# Patient Record
Sex: Female | Born: 1955
Health system: Southern US, Community
[De-identification: ages and names within clinical notes are randomized; demographics above are authoritative.]

## PROBLEM LIST (undated history)

## (undated) DIAGNOSIS — K5790 Diverticulosis of intestine, part unspecified, without perforation or abscess without bleeding: Secondary | ICD-10-CM

## (undated) DIAGNOSIS — K922 Gastrointestinal hemorrhage, unspecified: Secondary | ICD-10-CM

## (undated) DIAGNOSIS — Z8619 Personal history of other infectious and parasitic diseases: Secondary | ICD-10-CM

## (undated) DIAGNOSIS — T39395A Adverse effect of other nonsteroidal anti-inflammatory drugs [NSAID], initial encounter: Secondary | ICD-10-CM

## (undated) DIAGNOSIS — K219 Gastro-esophageal reflux disease without esophagitis: Secondary | ICD-10-CM

## (undated) DIAGNOSIS — I10 Essential (primary) hypertension: Secondary | ICD-10-CM

## (undated) DIAGNOSIS — Z860100 Personal history of colon polyps, unspecified: Secondary | ICD-10-CM

## (undated) DIAGNOSIS — M1990 Unspecified osteoarthritis, unspecified site: Secondary | ICD-10-CM

## (undated) DIAGNOSIS — E785 Hyperlipidemia, unspecified: Secondary | ICD-10-CM

## (undated) DIAGNOSIS — A630 Anogenital (venereal) warts: Secondary | ICD-10-CM

## (undated) DIAGNOSIS — M8580 Other specified disorders of bone density and structure, unspecified site: Secondary | ICD-10-CM

## (undated) HISTORY — DX: Hyperlipidemia, unspecified: E78.5

## (undated) HISTORY — DX: Unspecified osteoarthritis, unspecified site: M19.90

## (undated) HISTORY — DX: Gastro-esophageal reflux disease without esophagitis: K21.9

## (undated) HISTORY — DX: Other specified disorders of bone density and structure, unspecified site: M85.80

## (undated) HISTORY — PX: TUBAL LIGATION: SHX77

## (undated) HISTORY — DX: Adverse effect of other nonsteroidal anti-inflammatory drugs (NSAID), initial encounter: T39.395A

## (undated) HISTORY — DX: Personal history of colon polyps, unspecified: Z86.0100

## (undated) HISTORY — DX: Gastrointestinal hemorrhage, unspecified: K92.2

## (undated) HISTORY — DX: Personal history of colonic polyps: Z86.010

## (undated) HISTORY — DX: Personal history of other infectious and parasitic diseases: Z86.19

## (undated) HISTORY — DX: Essential (primary) hypertension: I10

## (undated) HISTORY — DX: Diverticulosis of intestine, part unspecified, without perforation or abscess without bleeding: K57.90

## (undated) HISTORY — DX: Anogenital (venereal) warts: A63.0

---

## 1997-12-23 HISTORY — PX: ABDOMINAL HYSTERECTOMY: SHX81

## 2004-12-23 LAB — HM PAP SMEAR: HM Pap smear: NORMAL

## 2009-12-23 LAB — HM COLONOSCOPY

## 2012-01-20 ENCOUNTER — Encounter: Payer: Self-pay | Admitting: Family

## 2012-01-20 ENCOUNTER — Ambulatory Visit (INDEPENDENT_AMBULATORY_CARE_PROVIDER_SITE_OTHER): Payer: Managed Care, Other (non HMO) | Admitting: Family

## 2012-01-20 DIAGNOSIS — M6208 Separation of muscle (nontraumatic), other site: Secondary | ICD-10-CM

## 2012-01-20 DIAGNOSIS — R300 Dysuria: Secondary | ICD-10-CM

## 2012-01-20 DIAGNOSIS — M6200 Separation of muscle (nontraumatic), unspecified site: Secondary | ICD-10-CM

## 2012-01-20 DIAGNOSIS — Z0000 Encounter for general adult medical examination without abnormal findings: Secondary | ICD-10-CM

## 2012-01-20 DIAGNOSIS — R3129 Other microscopic hematuria: Secondary | ICD-10-CM

## 2012-01-20 DIAGNOSIS — E785 Hyperlipidemia, unspecified: Secondary | ICD-10-CM

## 2012-01-20 DIAGNOSIS — B9789 Other viral agents as the cause of diseases classified elsewhere: Secondary | ICD-10-CM

## 2012-01-20 DIAGNOSIS — R5381 Other malaise: Secondary | ICD-10-CM

## 2012-01-20 DIAGNOSIS — B349 Viral infection, unspecified: Secondary | ICD-10-CM

## 2012-01-20 DIAGNOSIS — R5383 Other fatigue: Secondary | ICD-10-CM

## 2012-01-20 LAB — LIPID PANEL
Cholesterol: 287 mg/dL — ABNORMAL HIGH (ref 0–200)
Triglycerides: 219 mg/dL — ABNORMAL HIGH (ref <150)
VLDL: 44 mg/dL — ABNORMAL HIGH (ref 0–40)

## 2012-01-20 LAB — HEPATIC FUNCTION PANEL
ALT: 192 U/L — ABNORMAL HIGH (ref 0–35)
AST: 400 U/L — ABNORMAL HIGH (ref 0–37)
Alkaline Phosphatase: 99 U/L (ref 39–117)
Indirect Bilirubin: 0.6 mg/dL (ref 0.0–0.9)
Total Protein: 7.4 g/dL (ref 6.0–8.3)

## 2012-01-20 LAB — POCT URINALYSIS DIPSTICK
Protein, UA: NEGATIVE
Spec Grav, UA: 1.02
Urobilinogen, UA: 0.2
pH, UA: 6

## 2012-01-20 LAB — CBC WITH DIFFERENTIAL/PLATELET
Basophils Relative: 0 % (ref 0–1)
Hemoglobin: 14.9 g/dL (ref 12.0–15.0)
Lymphs Abs: 2.6 10*3/uL (ref 0.7–4.0)
Monocytes Relative: 6 % (ref 3–12)
Neutro Abs: 5.2 10*3/uL (ref 1.7–7.7)
Neutrophils Relative %: 61 % (ref 43–77)
RBC: 4.3 MIL/uL (ref 3.87–5.11)

## 2012-01-20 LAB — TSH: TSH: 3.772 u[IU]/mL (ref 0.350–4.500)

## 2012-01-20 MED ORDER — AMOXICILLIN-POT CLAVULANATE 875-125 MG PO TABS: 1.0000 | ORAL_TABLET | Freq: Two times a day (BID) | ORAL | Status: AC

## 2012-01-20 NOTE — Progress Notes (Signed)
  Subjective:    Patient ID: Dawn Huber, female    DOB: August 12, 1956, 56 y.o.   MRN: [id number]  HPI  [name] is a 56 yr old female who presents today to establish care. She was followed previously by [facility]. She tells me that she saw "whoever could get me in over there." She comes today to discuss concerns about swollen glands.    Swollen glands- she reports that her symptoms started as a mild sore throat about 3.5  weeks ago. She also notes some mild associated sore throat and ear pain.  Notes that it looked like she had an "inflatable tube" around the base of my neck.  In the last few weeks she reports associated fatigue, malaise and anorexia. She has lost 10 pounds.  She reports that the lymphadenopathy was never painful- just felt like pressure.  She notes some continued throat pain. Denies associated fever  Hyperlipidemia- she reports that she was unable to tolerate pravastatin.  Has been working on diet.  She reports that she had hemorrhoidal bleeding after a colonoscopy. Otherwise, she has not had any rectal bleeding. She reports + polyps in 2008- 2 precancerouse polyps.  2011, she repeated colo had 1 polyp- was told to follow up in 5 yrs. Dr. Jasmine Huber. She was told that she has a ventral hernia.  Notes  "lot of gas."  Genital wart-  Removed about 4-5 yrs ago.  Never came back.      Review of Systems  Constitutional: Positive for fatigue and unexpected weight change.  HENT: Positive for neck pain. Negative for congestion.   Eyes:       Wears corrective glasses  Respiratory: Negative for cough.   Cardiovascular:       Notes some swelling in the left ankle.  Chronic problem.    Gastrointestinal: Positive for diarrhea. Negative for nausea and vomiting.       Diarrhea x 1 week.  She reports that she normally has several BM's a day  Genitourinary: Positive for dysuria.  Musculoskeletal: Negative for myalgias.  Neurological:       + HA at base of head, mild    Hematological: Positive for adenopathy.  Psychiatric/Behavioral:       Denies depression/anxiety.         Objective:   Physical Exam  Constitutional: She appears well-developed and well-nourished.  HENT:  Head: Normocephalic and atraumatic.  Right Ear: Tympanic membrane and ear canal normal.  Left Ear: Tympanic membrane and ear canal normal.  Mouth/Throat: No posterior oropharyngeal edema or posterior oropharyngeal erythema.  Eyes: No scleral icterus.  Neck: Normal range of motion. Neck supple.       Exam is limited by overlying adipose, however I do not palpate significantly enlarged lymph nodes.  I do palpate a slightly tender LN above left clavicle which feels to be < 1cm  Cardiovascular: Normal rate and regular rhythm.   No murmur heard. Pulmonary/Chest: Effort normal and breath sounds normal. No respiratory distress. She has no wheezes. She has no rales. She exhibits no tenderness.  Abdominal: Soft. Bowel sounds are normal. She exhibits no distension. There is no tenderness.  Psych: she became tearful briefly during the interview.  A and O x 3, calm, pleasant and cooperative.         Assessment & Plan:

## 2012-01-20 NOTE — Patient Instructions (Signed)
Please complete your lab work prior to leaving.  Schedule a follow up/physical in 2 weeks.  Welcome to [facility]!

## 2012-01-20 NOTE — Assessment & Plan Note (Signed)
Tolerating Pravastatin. Obtain LFT, FLP.

## 2012-01-20 NOTE — Assessment & Plan Note (Addendum)
I suspect that pt's symptoms are most likely due to a viral illness.  Given her complaints about lymphadenopathy however, will plan to treat empirically with augmentin.  Will have pt follow up in 2 weeks, if no improvement consider CT neck.  Obtain baseline laboratories as below.

## 2012-01-20 NOTE — Assessment & Plan Note (Signed)
Clinically consistent with rectus diastasis. We discussed weight loss today. Monitor for now, if worsening or symptomatic, consider referral to surgery.

## 2012-01-20 NOTE — Assessment & Plan Note (Signed)
New, UA notes trace blood. Obtain culture.

## 2012-01-21 ENCOUNTER — Telehealth: Payer: Self-pay | Admitting: Family

## 2012-01-21 LAB — BASIC METABOLIC PANEL WITH GFR
CO2: 27 mEq/L (ref 19–32)
GFR, Est African American: >89 mL/min
Glucose, Bld: 89 mg/dL (ref 70–99)
Potassium: 4.3 mEq/L (ref 3.5–5.3)
Sodium: 137 mEq/L (ref 135–145)

## 2012-01-21 NOTE — Telephone Encounter (Addendum)
Spoke to patient to let her know that her liver function tests are very high. She reports feeling ok today.  She will return tomorrow at 8:45 for an apt for further evaluation.

## 2012-01-21 NOTE — Telephone Encounter (Signed)
Appointment has been made

## 2012-01-21 NOTE — Telephone Encounter (Signed)
Pls add her to schedule for tomorrow at 8:45.

## 2012-01-22 ENCOUNTER — Telehealth: Payer: Self-pay | Admitting: Family

## 2012-01-22 ENCOUNTER — Ambulatory Visit
Admission: RE | Admit: 2012-01-22 | Discharge: 2012-01-22 | Disposition: A | Discharge status: Home / Self Care | Payer: Managed Care, Other (non HMO) | Source: Ambulatory Visit | Attending: Family | Admitting: Family

## 2012-01-22 ENCOUNTER — Ambulatory Visit (INDEPENDENT_AMBULATORY_CARE_PROVIDER_SITE_OTHER): Payer: Managed Care, Other (non HMO) | Admitting: Family

## 2012-01-22 ENCOUNTER — Encounter: Payer: Self-pay | Admitting: Family

## 2012-01-22 DIAGNOSIS — R109 Unspecified abdominal pain: Secondary | ICD-10-CM

## 2012-01-22 DIAGNOSIS — R7401 Elevation of levels of liver transaminase levels: Secondary | ICD-10-CM | POA: Insufficient documentation

## 2012-01-22 DIAGNOSIS — R591 Generalized enlarged lymph nodes: Secondary | ICD-10-CM

## 2012-01-22 DIAGNOSIS — R599 Enlarged lymph nodes, unspecified: Secondary | ICD-10-CM

## 2012-01-22 LAB — HEPATIC FUNCTION PANEL
Albumin: 4.5 g/dL (ref 3.5–5.2)
Alkaline Phosphatase: 98 U/L (ref 39–117)
Bilirubin, Direct: 0.1 mg/dL (ref 0.0–0.3)
Indirect Bilirubin: 0.5 mg/dL (ref 0.0–0.9)
Total Bilirubin: 0.6 mg/dL (ref 0.3–1.2)

## 2012-01-22 LAB — SEDIMENTATION RATE: Sed Rate: 10 mm/hr (ref 0–22)

## 2012-01-22 MED ORDER — IOHEXOL 300 MG/ML  SOLN
125.0000 mL | Freq: Once | INTRAMUSCULAR | Status: AC | PRN
  Administered 2012-01-22: 125 mL via INTRAVENOUS

## 2012-01-22 MED ORDER — IOHEXOL 300 MG/ML  SOLN
30.0000 mL | Freq: Once | INTRAMUSCULAR | Status: AC | PRN
  Administered 2012-01-22: 30 mL via ORAL

## 2012-01-22 NOTE — Progress Notes (Signed)
Subjective:    Patient ID: Dawn Huber, female    DOB: 01/16/1956, 56 y.o.   MRN: [id number]  HPI  [name] is a 56 yr old female who presents today for follow up.  She was seen on 1/28 with complaint of anorexia, malaise and LAD x 3 weeks.   She was started on empiric Augmentin and blood work was obtained.  She was noted to have a macrocytosis on CBC and also noted to have significantly elevated transaminases.  AST 400, ALT 192.  Today she reports that she has had some abdominal pain, and bloating. She continues to have associated anorexia but denies associated fever.  Of note, she tells me that her husband has hx of hepatitis C and has undergone interferon therapy. She tells me that she was tested for hepatitis C 10 yrs ago and was negative. She also tells me that she drinks wine about 3 nights a week.  Reports that on Sunday she had four 8 ounce glasses of wine.  She is on several supplements- one of which is some sort of OTC "liver supplement."   Rash- she reports a 1 yr hx of rash on her left hand which she tells me she was told was "inflammation."  Review of Systems    see HPI  Past Medical History  Diagnosis Date  . History of chicken pox   . Genital warts   . Hypertension   . Personal history of colonic polyps   . Diverticulosis     History   Social History  . Marital Status: Married    Spouse Name: N/A    Number of Children: 2  . Years of Education: N/A   Occupational History  . Not on file.   Social History Main Topics  . Smoking status: Current Everyday Smoker  . Smokeless tobacco: Never Used   Comment: 2 packs a week  . Alcohol Use: Yes     2 liters wine a week  . Drug Use: Not on file  . Sexually Active: Not on file   Other Topics Concern  . Not on file   Social History Narrative   Regular exercise:  No, walks dog dailyCaffeine Use:  12oz pepsi dailyWorks as a hairstylist.She has 2 grown children and 4 grandchildren- family lives locally.Married       Past Surgical History  Procedure Date  . Abdominal hysterectomy 1999    partial    Family History  Problem Relation Age of Onset  . Cancer Mother     lung  . Emphysema Father   . Stroke Father   . Seizures Father   . Heart disease Father   . Hypertension Father   . Cancer Maternal Aunt     colon    No Known Allergies  Current Outpatient Prescriptions on File Prior to Visit  Medication Sig Dispense Refill  . amoxicillin-clavulanate (AUGMENTIN) 875-125 MG per tablet Take 1 tablet by mouth every 12 (twelve) hours.  20 tablet  0  . aspirin 81 MG tablet Take 324 mg by mouth daily.      . Calcium Carbonate-Vit D-Min (CALCIUM 1200 PO) Take 1 tablet by mouth daily.      . Cholecalciferol (VITAMIN D3) 2000 UNITS TABS Take 1 tablet by mouth daily.      . lansoprazole (PREVACID) 15 MG capsule Take 15 mg by mouth daily.        BP 130/90  Pulse 74  Temp(Src) 98.1 F (36.7 C) (Oral)  Resp 16  Ht 5' 3.5" (1.613 m)  Wt 215 lb 1.3 oz (97.56 kg)  BMI 37.50 kg/m2  SpO2 98%    Objective:   Physical Exam  Constitutional: She appears well-developed and well-nourished. No distress.  Cardiovascular: Normal rate and regular rhythm.   No murmur heard. Pulmonary/Chest: Effort normal and breath sounds normal. No respiratory distress. She has no wheezes. She has no rales. She exhibits no tenderness.  Abdominal:       Mild epigastric and right upper quadrant tenderness.   Skin: Skin is warm and dry.       Hyperpigmented rash on left hand.   Psychiatric: She has a normal mood and affect. Her behavior is normal. Judgment and thought content normal.          Assessment & Plan:

## 2012-01-22 NOTE — Telephone Encounter (Signed)
Notified pt. 

## 2012-01-22 NOTE — Telephone Encounter (Signed)
Patient returned phone call. [facility] # [phone]

## 2012-01-22 NOTE — Patient Instructions (Signed)
Please complete your lab work prior to leaving and your CT on the first floor. Please follow up in 2 weeks.

## 2012-01-22 NOTE — Assessment & Plan Note (Addendum)
Etiology is unclear at this point.  Acute viral illness, viral hepatitis (?acute hep C) is a possibility.  Due to abdominal pain, will obtain a CT abd/pelvis to further evaluate.  In addition will obtain follow up LFT's to monitor trend, acute hepatitis panel and HIV screen.  Will also add autoimmune studies due to associated rash.  Continue empiric Augmentin. Pt instructed to stop all supplements and abstain from alcohol. Follow up in 2 weeks.  Case was discussed with Dr. Rodena Medin.

## 2012-01-22 NOTE — Telephone Encounter (Signed)
Left message requesting that pt return our call. CT looks good.  Just notes fatty liver.  She should work on low fat/low cholesterol diet, exercise and weight loss to help this.  Also notes hiatal hernia which is common.  No other liver abnormalities.  Blood work is still pending.

## 2012-01-23 LAB — HEPATITIS PANEL, ACUTE
HCV Ab: NEGATIVE
Hep A IgM: NEGATIVE
Hep B C IgM: NEGATIVE

## 2012-01-23 LAB — HIV ANTIBODY (ROUTINE TESTING W REFLEX): HIV: NONREACTIVE

## 2012-01-23 LAB — RHEUMATOID FACTOR: Rhuematoid fact SerPl-aCnc: <10 IU/mL (ref <=14)

## 2012-01-24 ENCOUNTER — Telehealth: Payer: Self-pay | Admitting: Family

## 2012-01-24 NOTE — Telephone Encounter (Signed)
Please let pt know that her liver function tests are improving.  Hepatitis and HIV screens are negative.  Hopefully things will continue to improve. I would still like to see her back for her 2 week follow up apt please.

## 2012-01-24 NOTE — Telephone Encounter (Signed)
Notified pt, she will follow up on 02/03/12. Pt states gland on left side of neck may be a little smaller and she is still taking the antibiotic. Advised pt to complete antibiotic and give [place] a call if the gland continues to get larger.

## 2012-01-28 ENCOUNTER — Telehealth: Payer: Self-pay | Admitting: Family

## 2012-01-28 NOTE — Telephone Encounter (Signed)
Pt called asking if ok to take robitussin DM for some congestion.  I told her that it is OK.

## 2012-02-03 ENCOUNTER — Encounter: Payer: Self-pay | Admitting: Family

## 2012-02-03 ENCOUNTER — Ambulatory Visit (INDEPENDENT_AMBULATORY_CARE_PROVIDER_SITE_OTHER): Payer: Managed Care, Other (non HMO) | Admitting: Family

## 2012-02-03 DIAGNOSIS — Z0000 Encounter for general adult medical examination without abnormal findings: Secondary | ICD-10-CM | POA: Insufficient documentation

## 2012-02-03 DIAGNOSIS — D7589 Other specified diseases of blood and blood-forming organs: Secondary | ICD-10-CM

## 2012-02-03 DIAGNOSIS — R82998 Other abnormal findings in urine: Secondary | ICD-10-CM

## 2012-02-03 DIAGNOSIS — R3129 Other microscopic hematuria: Secondary | ICD-10-CM

## 2012-02-03 DIAGNOSIS — R7989 Other specified abnormal findings of blood chemistry: Secondary | ICD-10-CM

## 2012-02-03 LAB — POCT URINALYSIS DIPSTICK
Bilirubin, UA: NEGATIVE
Glucose, UA: NEGATIVE
Ketones, UA: NEGATIVE
Leukocytes, UA: NEGATIVE
pH, UA: 6

## 2012-02-03 LAB — HEPATIC FUNCTION PANEL
AST: 120 U/L — ABNORMAL HIGH (ref 0–37)
Alkaline Phosphatase: 82 U/L (ref 39–117)
Bilirubin, Direct: 0.1 mg/dL (ref 0.0–0.3)
Total Bilirubin: 0.4 mg/dL (ref 0.3–1.2)

## 2012-02-03 NOTE — Assessment & Plan Note (Signed)
Refer for dexa.  Pt was commended on discontinuation of alcohol and cutting back on smoking.  I encouraged her to continue with complete smoking cessation.  We also talked about exercise, weight loss, low fat diet.

## 2012-02-03 NOTE — Patient Instructions (Signed)
Please complete your blood work prior to leaving. Schedule your bone density at the front desk. Follow up in 3 months.

## 2012-02-03 NOTE — Assessment & Plan Note (Signed)
She is noted to have microscopic hematuria today, no leuks or nitrites.  Monitor for now.

## 2012-02-03 NOTE — Progress Notes (Signed)
Subjective:    Patient ID: Dawn Huber, female    DOB: 1956-07-12, 56 y.o.   MRN: [id number]  HPI  [name] is a 56 yr old female who presents today for her annual CPX.  Preventative- Recently started back at the gym. Eating healthy.  Last pap was 2007, last mammogram was <1 yr ago.  She thinks it was [place] and was normal.  She had colo 11/11- told that she needs a 5 yr follow up. Dexa- never.  She has completely discontinued use of alcohol.  Lymphadenopathy-  Notes less stomach pressure.  Appetite is still not "big", but trying to eat healthy and has recently lost 2 pounds.  Less nausea than when she was eating "fatty greasy stuff."   Tobacco- down to 1 pack of cigattes a week.  Still working on quitting.   She reports that her urine has been dark.  Denies dysuria.   Review of Systems  Constitutional: Negative for unexpected weight change.  HENT: Negative for tinnitus.        Deaf in left ear since age 71  Eyes: Negative for visual disturbance.  Respiratory: Negative for shortness of breath.   Cardiovascular: Negative for chest pain.  Gastrointestinal: Negative for nausea.  Genitourinary: Negative for hematuria.  Musculoskeletal: Negative for myalgias and arthralgias.  Skin: Negative for rash.  Neurological: Positive for headaches.       Mild headaches  Hematological: Positive for adenopathy.  Psychiatric/Behavioral:       Denies depression/anxiety   Past Medical History  Diagnosis Date  . History of chicken pox   . Genital warts   . Hypertension   . Personal history of colonic polyps   . Diverticulosis     History   Social History  . Marital Status: Married    Spouse Name: N/A    Number of Children: 2  . Years of Education: N/A   Occupational History  . Not on file.   Social History Main Topics  . Smoking status: Current Everyday Smoker  . Smokeless tobacco: Never Used   Comment: 1 packs a week  . Alcohol Use: No  . Drug Use: Not on file  .  Sexually Active: Not on file   Other Topics Concern  . 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 Abdominal hysterectomy 1999    partial    Family History  Problem Relation Age of Onset  . Cancer Mother     lung  . Emphysema Father   . Stroke Father   . Seizures Father   . Heart disease Father   . Hypertension Father   . Cancer Maternal Aunt     colon    No Known Allergies  Current Outpatient Prescriptions on File Prior to Visit  Medication Sig Dispense Refill  . aspirin 81 MG tablet Take 324 mg by mouth daily.      . Calcium Carbonate-Vit D-Min (CALCIUM 1200 PO) Take 1 tablet by mouth daily.      . Cholecalciferol (VITAMIN D3) 2000 UNITS TABS Take 1 tablet by mouth daily.      . lansoprazole (PREVACID) 15 MG capsule Take 15 mg by mouth daily.        BP 106/70  Pulse 72  Temp(Src) 97.8 F (  36.6 C) (Oral)  Resp 16  Wt 213 lb 1.3 oz (96.652 kg)  SpO2 97%       Objective:   Physical Exam  Physical Exam  Constitutional: She is oriented to person, place, and time. She appears well-developed and well-nourished. No distress.  HENT:  Head: Normocephalic and atraumatic.  Right Ear: Tympanic membrane and ear canal normal.  Left Ear: Tympanic membrane and ear canal normal.  Mouth/Throat: Oropharynx is clear and moist.  Eyes: Pupils are equal, round, and reactive to light. No scleral icterus.  Neck: Normal range of motion. No thyromegaly present. Neck is thick.  Significant overlying adipose tissue.  No palpable LAD noted.   Cardiovascular: Normal rate and regular rhythm.   No murmur heard. Pulmonary/Chest: Effort normal and breath sounds normal. No respiratory distress. He has no wheezes. She has no rales. She exhibits no tenderness.  Abdominal: Soft. Bowel  sounds are normal. He exhibits no distension and no mass. There is no tenderness. There is no rebound and no guarding.  Musculoskeletal: She exhibits no edema.  Lymphadenopathy:    She has no cervical adenopathy, and no axillary LAD. Neurological: She is alert and oriented to person, place, and time. She has normal reflexes. She exhibits normal muscle tone. Coordination normal.  Skin: Skin is warm and dry.  Psychiatric: She has a normal mood and affect. Her behavior is normal. Judgment and thought content normal.  Breasts: Examined lying Right: Without masses, retractions, discharge or axillary adenopathy.  Left: Without masses, retractions, discharge or axillary adenopathy.  Inguinal/mons: Normal without inguinal adenopathy  External genitalia: Normal  BUS/Urethra/Skene's glands: Normal  Bladder: Normal  Vagina: Normal  Cervix: Surgically absent Uterus: surgically absent Adnexa/parametria:  Rt: Without masses or tenderness.  Lt: Without masses or tenderness.  Anus and perineum: Normal           Assessment & Plan:         Assessment & Plan:

## 2012-02-03 NOTE — Assessment & Plan Note (Signed)
Noted on labs last visit. Obtain b12/folate.  Continue off of alcohol.

## 2012-02-03 NOTE — Assessment & Plan Note (Signed)
Obtain follow up LFT's today. I do not appreciate any palpable cervical lymphadenopathy today.

## 2012-02-10 ENCOUNTER — Ambulatory Visit (INDEPENDENT_AMBULATORY_CARE_PROVIDER_SITE_OTHER)
Admission: RE | Admit: 2012-02-10 | Discharge: 2012-02-10 | Disposition: A | Discharge status: Home / Self Care | Payer: Managed Care, Other (non HMO) | Source: Ambulatory Visit

## 2012-02-10 DIAGNOSIS — Z13820 Encounter for screening for osteoporosis: Secondary | ICD-10-CM

## 2012-02-18 ENCOUNTER — Encounter: Payer: Self-pay | Admitting: Family

## 2012-02-18 DIAGNOSIS — M8580 Other specified disorders of bone density and structure, unspecified site: Secondary | ICD-10-CM | POA: Insufficient documentation

## 2012-02-18 HISTORY — DX: Other specified disorders of bone density and structure, unspecified site: M85.80

## 2012-05-04 ENCOUNTER — Encounter: Payer: Self-pay | Admitting: Family

## 2012-05-04 ENCOUNTER — Encounter: Payer: Self-pay | Admitting: Gastroenterology

## 2012-05-04 ENCOUNTER — Ambulatory Visit (INDEPENDENT_AMBULATORY_CARE_PROVIDER_SITE_OTHER): Payer: Managed Care, Other (non HMO) | Admitting: Family

## 2012-05-04 VITALS — BP 122/77 | HR 67 | Temp 98.2°F | Resp 16 | Ht 63.5 in | Wt 213.0 lb

## 2012-05-04 DIAGNOSIS — K625 Hemorrhage of anus and rectum: Secondary | ICD-10-CM

## 2012-05-04 DIAGNOSIS — K760 Fatty (change of) liver, not elsewhere classified: Secondary | ICD-10-CM

## 2012-05-04 DIAGNOSIS — R7989 Other specified abnormal findings of blood chemistry: Secondary | ICD-10-CM

## 2012-05-04 DIAGNOSIS — Z8719 Personal history of other diseases of the digestive system: Secondary | ICD-10-CM

## 2012-05-04 DIAGNOSIS — K7689 Other specified diseases of liver: Secondary | ICD-10-CM

## 2012-05-04 DIAGNOSIS — R3129 Other microscopic hematuria: Secondary | ICD-10-CM

## 2012-05-04 DIAGNOSIS — E785 Hyperlipidemia, unspecified: Secondary | ICD-10-CM | POA: Insufficient documentation

## 2012-05-04 LAB — HEPATIC FUNCTION PANEL
AST: 26 U/L (ref 0–37)
Bilirubin, Direct: 0.1 mg/dL (ref 0.0–0.3)
Indirect Bilirubin: 0.3 mg/dL (ref 0.0–0.9)
Total Bilirubin: 0.4 mg/dL (ref 0.3–1.2)

## 2012-05-04 LAB — HEPATITIS B SURFACE ANTIGEN: Hepatitis B Surface Ag: NEGATIVE

## 2012-05-04 LAB — CBC WITH DIFFERENTIAL/PLATELET
Eosinophils Absolute: 0.2 10*3/uL (ref 0.0–0.7)
Eosinophils Relative: 3 % (ref 0–5)
HCT: 41.2 % (ref 36.0–46.0)
Lymphocytes Relative: 43 % (ref 12–46)
Lymphs Abs: 3.1 10*3/uL (ref 0.7–4.0)
MCH: 31.9 pg (ref 26.0–34.0)
MCV: 96.7 fL (ref 78.0–100.0)
Monocytes Absolute: 0.5 10*3/uL (ref 0.1–1.0)
RDW: 12.7 % (ref 11.5–15.5)
WBC: 7 10*3/uL (ref 4.0–10.5)

## 2012-05-04 LAB — LIPID PANEL
HDL: 50 mg/dL (ref >39)
LDL Cholesterol: 182 mg/dL — ABNORMAL HIGH (ref 0–99)
Total CHOL/HDL Ratio: 5.8 Ratio

## 2012-05-04 LAB — HEPATITIS C ANTIBODY: HCV Ab: NEGATIVE

## 2012-05-04 LAB — IRON AND TIBC: UIBC: 175 ug/dL (ref 125–400)

## 2012-05-04 NOTE — Progress Notes (Signed)
Subjective:    Patient ID: Dawn Huber, female    DOB: 1956-09-02, 56 y.o.   MRN: [id number]  HPI  [name] is a 56 yr old female who presents today for follow up of her elevated LFT's.  She underwent abdominal imaging which noted moderate fatty liver.  LFT's were trending down last visit.  Rectal bleeding- She reports hx of hemorrhoids on colonoscopy.  Reports intermittent bright red blood per rectum with cramping- last episode 1 week ago. When this happens, she reports that the blood "fills up the bowl." Last week she used a preparation H suppostitory. Last colo was performed by Dr. Jasmine Huber at [facility], but she tells me that she wishes to keep her care within the [facility] system moving forward.  Review of Systems See HPI  Past Medical History  Diagnosis Date  . History of chicken pox   . Genital warts   . Hypertension   . Personal history of colonic polyps   . Diverticulosis   . Osteopenia 02/18/2012    History   Social History  . Marital Status: Married    Spouse Name: N/A    Number of Children: 2  . Years of Education: N/A   Occupational History  . Not on file.   Social History Main Topics  . Smoking status: Current Everyday Smoker  . Smokeless tobacco: Never Used   Comment: 1 packs a week  . Alcohol Use: No  . Drug Use: Not on file  . Sexually Active: Not on file   Other Topics Concern  . 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 Abdominal hysterectomy 1999    partial    Family History  Problem Relation Age of Onset  . Cancer Mother     lung  . Emphysema Father   . Stroke Father   . Seizures Father   . Heart disease Father   . Hypertension Father   . Cancer Maternal Aunt     colon    No Known Allergies  Current Outpatient  Prescriptions on File Prior to Visit  Medication Sig Dispense Refill  . aspirin 81 MG tablet Take 324 mg by mouth daily.      . Calcium Carbonate-Vit D-Min (CALCIUM 1200 PO) Take 1 tablet by mouth daily.      . Cholecalciferol (VITAMIN D3) 2000 UNITS TABS Take 1 tablet by mouth daily.      . lansoprazole (PREVACID) 15 MG capsule Take 15 mg by mouth daily.        BP 122/77  Pulse 67  Temp(Src) 98.2 F (36.8 C) (Oral)  Resp 16  Ht 5' 3.5" (1.613 m)  Wt 213 lb (96.616 kg)  BMI 37.14 kg/m2       Objective:   Physical Exam  Constitutional: She appears well-developed and well-nourished. No distress.  Cardiovascular: Normal rate and regular rhythm.   No murmur heard. Pulmonary/Chest: Effort normal and breath sounds normal. No respiratory distress. She has no wheezes. She has no rales. She exhibits no tenderness.  Abdominal: Soft. Bowel sounds are normal. She exhibits no distension and no mass. There is no tenderness. There is no rebound and no guarding.  Musculoskeletal: She exhibits no edema.  Psychiatric: She has a normal mood and affect. Her behavior is  normal. Judgment and thought content normal.          Assessment & Plan:

## 2012-05-04 NOTE — Patient Instructions (Signed)
You will be contact about your referral to GI.   Please let us know if you have not heard back within 1 week about your referral.

## 2012-05-04 NOTE — Assessment & Plan Note (Addendum)
Likely elevated due to fatty liver.  She reports that she continues to abstain from alcohol. We discussed importance of low fat diet, exercise and weight loss.  Repeat LFT's today.  Will also check hepatitis studies, ceruloplasmin, ferritin levels.

## 2012-05-04 NOTE — Assessment & Plan Note (Signed)
Asymptomatic.  Repeat UA with micro.

## 2012-05-04 NOTE — Assessment & Plan Note (Signed)
Will refer to GI for further evaluation.  ? Hemorrhoidal.  Obtain CBC.

## 2012-05-04 NOTE — Assessment & Plan Note (Signed)
Obtain lipid panel

## 2012-05-05 ENCOUNTER — Telehealth: Payer: Self-pay | Admitting: Family

## 2012-05-05 DIAGNOSIS — E785 Hyperlipidemia, unspecified: Secondary | ICD-10-CM

## 2012-05-05 LAB — URINALYSIS, ROUTINE W REFLEX MICROSCOPIC
Hgb urine dipstick: NEGATIVE
Leukocytes, UA: NEGATIVE
Nitrite: NEGATIVE
Protein, ur: NEGATIVE mg/dL
Urobilinogen, UA: 0.2 mg/dL (ref 0.0–1.0)

## 2012-05-05 LAB — CERULOPLASMIN: Ceruloplasmin: 25 mg/dL (ref 20–60)

## 2012-05-05 MED ORDER — PITAVASTATIN CALCIUM 2 MG PO TABS: 1.0000 | ORAL_TABLET | Freq: Every day | ORAL | Status: DC

## 2012-05-05 NOTE — Telephone Encounter (Signed)
Spoke with pt re: lab work and hyperlipidemia.  Will start livalo 2mg  (she reports hx of myalgia in the past on another statin).  Pt is instructed to follow up in lab in 1 month for FLP and LFT (diagnosis hyperlipidemia). (please send order to lab).  She is also instructed to call if recurrent myalgias.  Will leave samples of livalo 2mg  (#7 tabs) at front desk with coupon card.

## 2012-05-05 NOTE — Telephone Encounter (Signed)
Future order placed and copy given to the lab. 

## 2012-05-18 ENCOUNTER — Encounter: Payer: Self-pay | Admitting: Family

## 2012-05-29 ENCOUNTER — Ambulatory Visit: Payer: Managed Care, Other (non HMO) | Admitting: Gastroenterology

## 2012-06-01 NOTE — Telephone Encounter (Signed)
Addended by: Mervin Kung A on: 06/01/2012 11:30 AM   Modules accepted: Orders

## 2012-06-01 NOTE — Telephone Encounter (Signed)
Pt presented to the lab and asked if we were rechecking her iron level. Future order released. Advised pt per Provider that we will recheck her iron at her follow up in August. Pt voices understanding.

## 2012-06-02 ENCOUNTER — Encounter: Payer: Self-pay | Admitting: Family

## 2012-06-02 LAB — HEPATIC FUNCTION PANEL
Albumin: 4.1 g/dL (ref 3.5–5.2)
Total Protein: 6.7 g/dL (ref 6.0–8.3)

## 2012-06-02 LAB — LIPID PANEL
Cholesterol: 176 mg/dL (ref 0–200)
HDL: 49 mg/dL (ref >39)
Triglycerides: 154 mg/dL — ABNORMAL HIGH (ref <150)

## 2012-06-12 IMAGING — CT CT ABD-PELV W/ CM
2 of 4 series · 14 of 36 positions shown, 19 images · IV contrast (30CC OMNI 300 & [ID] OMNI 300)
Comparison: None.

CLINICAL DATA: Abdominal pain.  Elevated liver function tests.
Abdominal distention.  Nausea and diarrhea.  Micro hematuria.

CT ABDOMEN AND PELVIS WITH CONTRAST
TECHNIQUE: Multidetector CT imaging of the abdomen and pelvis was
performed following the standard protocol during bolus
administration of intravenous contrast.
Contrast: 30mL OMNIPAQUE IOHEXOL 300 MG/ML IV SOLN, 125mL OMNIPAQUE
IOHEXOL 300 MG/ML IV SOLN

[Series 2: abd/pelvis with · axial · 0.90mm/px · z∈[-388,+32]mm · 13 of 94 slices shown, 17 images]
[im 5/94  soft-tissue]
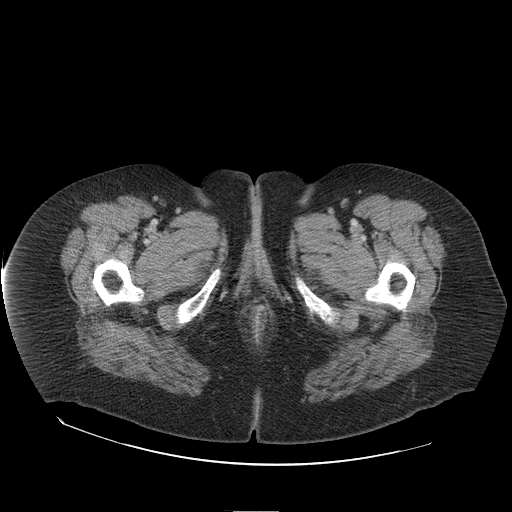
[im 5/94  bone]
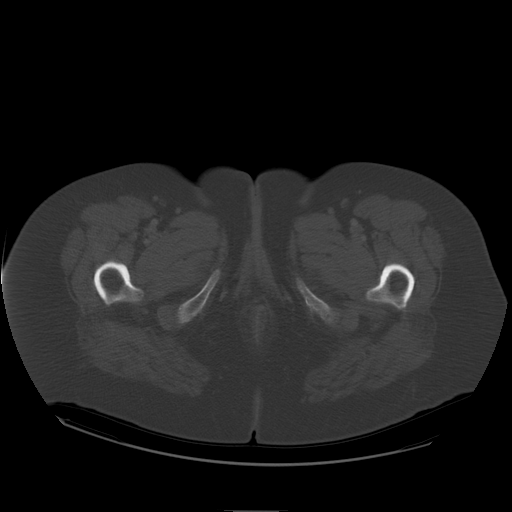
[im 14/94  soft-tissue]
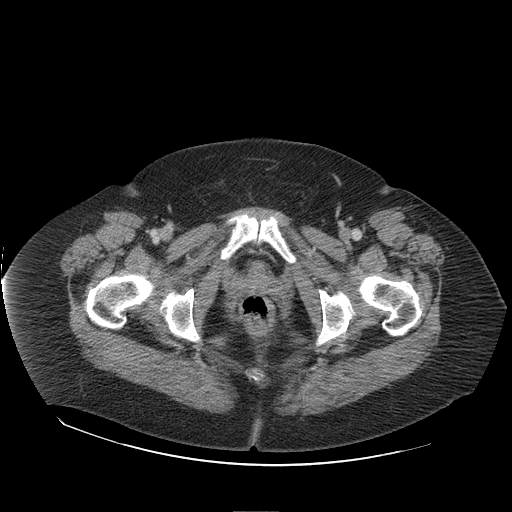
[im 24/94  soft-tissue]
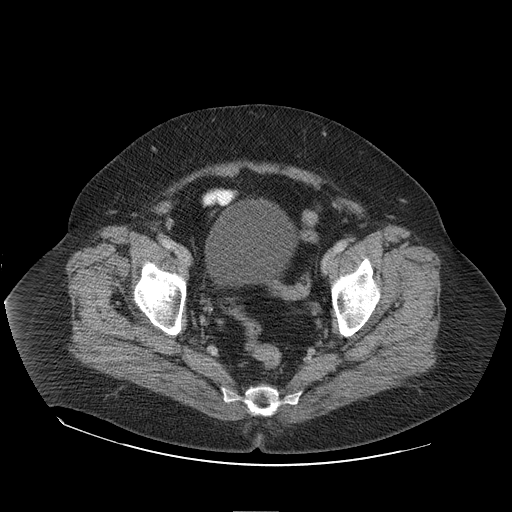
[im 33/94  soft-tissue]
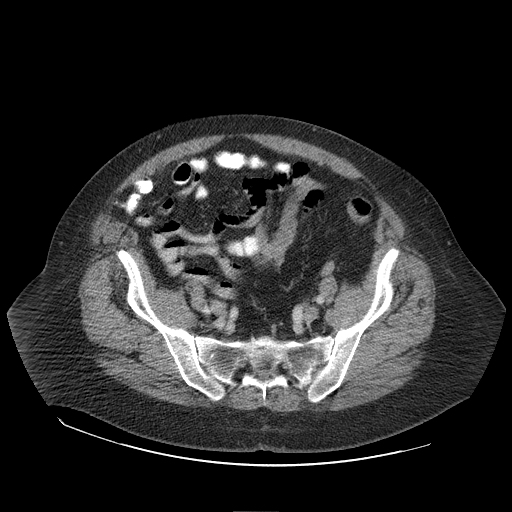
[im 38/94  soft-tissue]
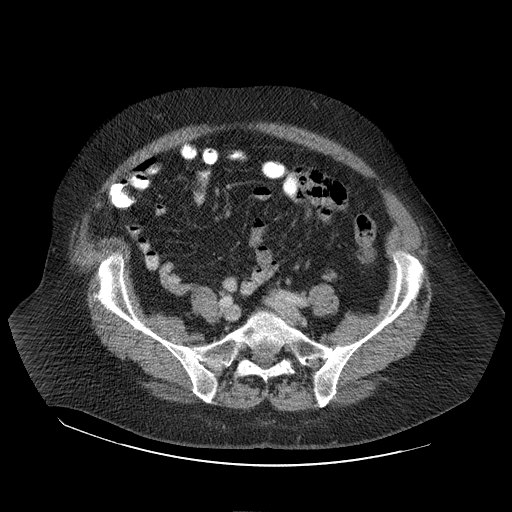
[im 47/94  soft-tissue]
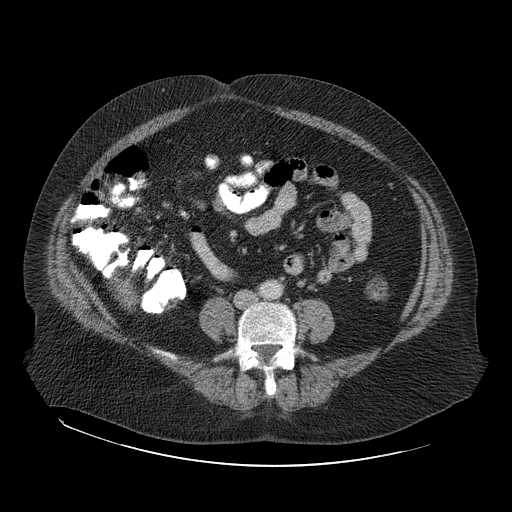
[im 56/94  soft-tissue]
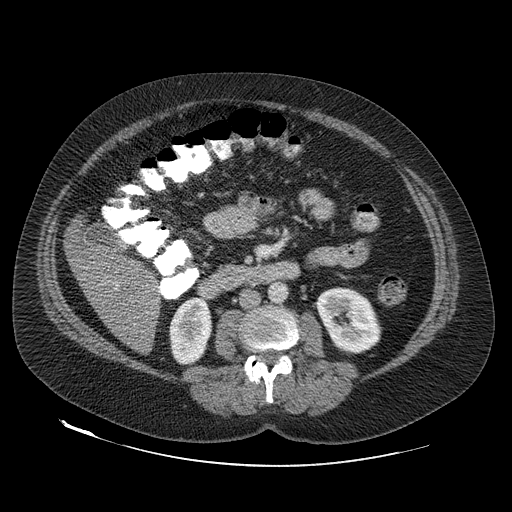
[im 61/94  soft-tissue]
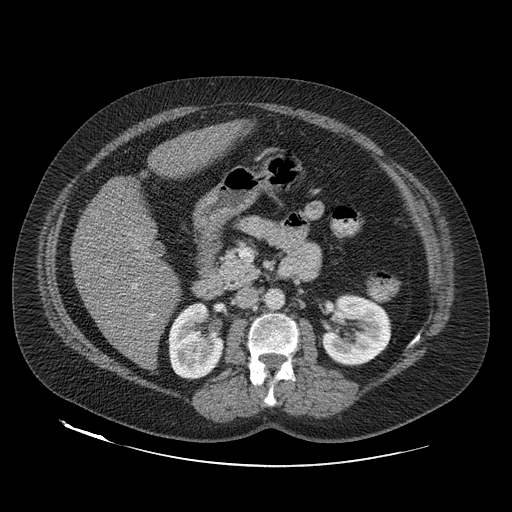
[im 70/94  soft-tissue]
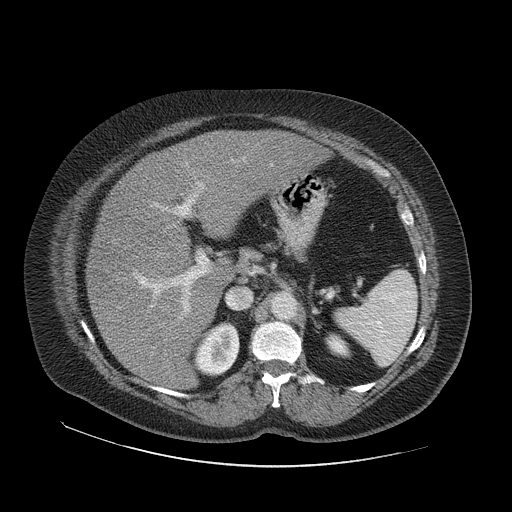
[im 70/94  bone]
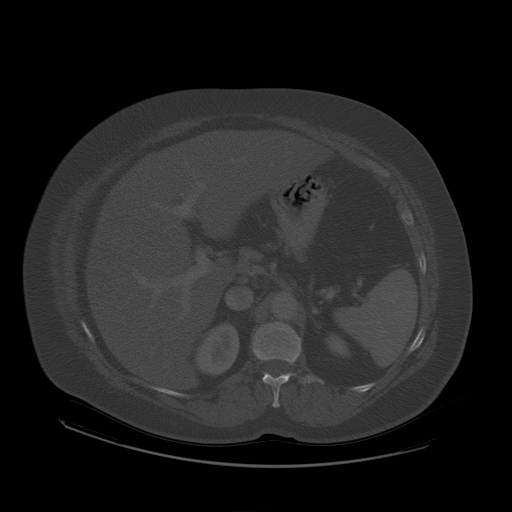
[im 75/94  lung]
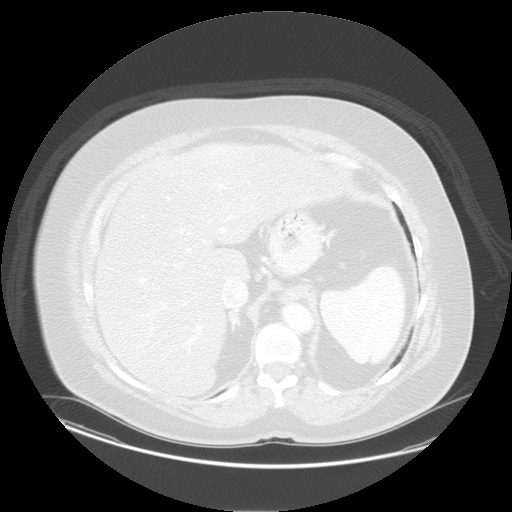
[im 80/94  soft-tissue]
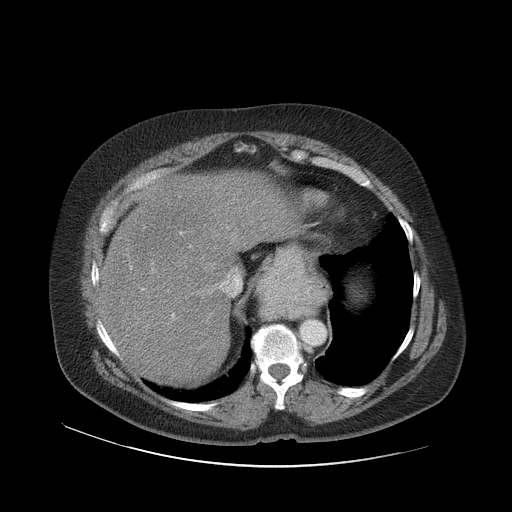
[im 80/94  lung]
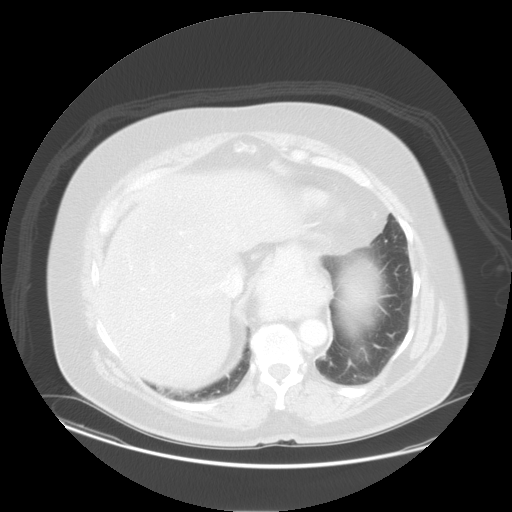
[im 84/94  lung]
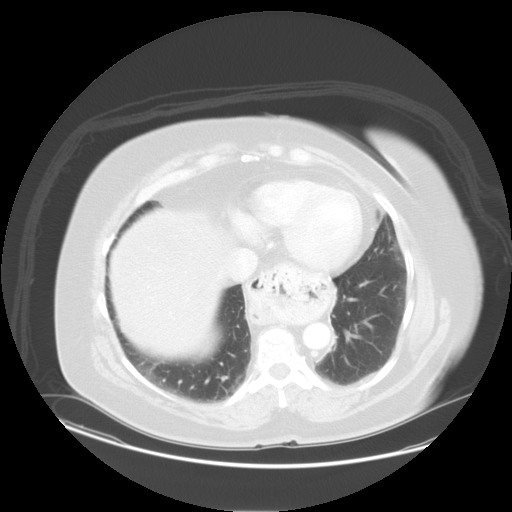
[im 89/94  soft-tissue]
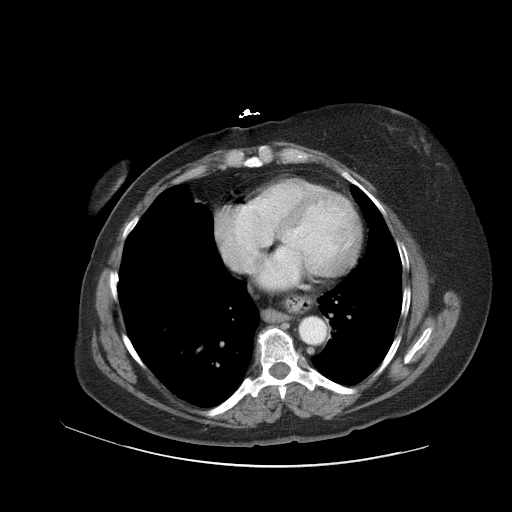
[im 89/94  lung]
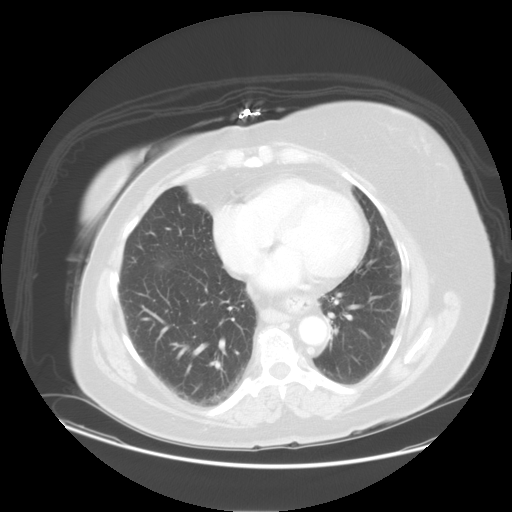

[Series 401: sagittal · sagittal · 0.93mm/px · 1 of 183 slices shown, 2 images]
[im 61/183  soft-tissue]
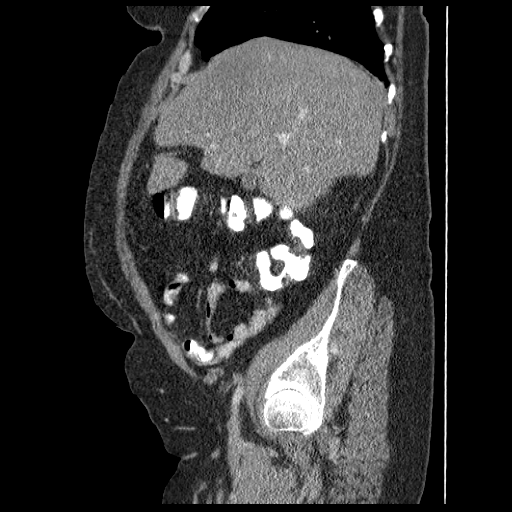
[im 61/183  bone]
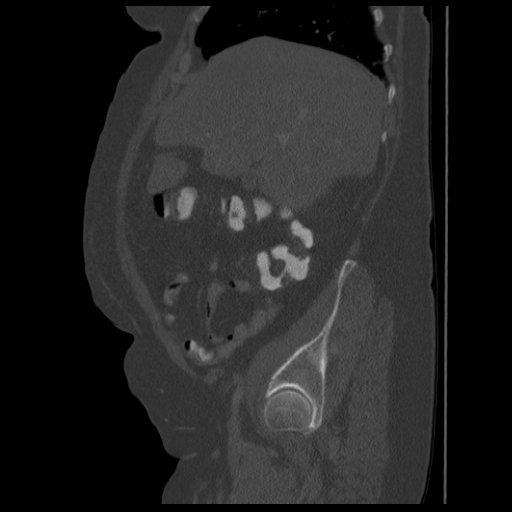

[14 of 36 positions shown; findings below may reference images not displayed]

FINDINGS: A moderate size hiatal hernia is seen.  Moderate diffuse
hepatic steatosis also demonstrated.  No liver masses are
identified.  Gallbladder is unremarkable.

The pancreas, spleen, adrenal glands, and kidneys are normal in
appearance.  No evidence of hydronephrosis.  No soft tissue masses
or lymphadenopathy identified within the abdomen.

Prior hysterectomy noted.  Vaginal cuff and adnexa are unremarkable
in appearance.  Mild diverticulosis is seen involving the sigmoid
colon, however there is no evidence of diverticulitis.  No other
inflammatory process or abnormal fluid collections are identified.
No evidence of bowel wall thickening or dilatation.  No hernia
identified.
IMPRESSION: 1.  No acute findings.
2.  Mild sigmoid diverticulosis.  No radiographic evidence of
diverticulitis.
3.  Moderate hiatal hernia.
4.  Moderate hepatic steatosis.  No evidence of liver mass or
hepatic steatosis.

## 2012-08-03 ENCOUNTER — Ambulatory Visit: Payer: Managed Care, Other (non HMO) | Admitting: Family

## 2012-08-17 ENCOUNTER — Other Ambulatory Visit: Payer: Self-pay | Admitting: Family

## 2012-08-17 NOTE — Telephone Encounter (Signed)
A 30 day supply of Livalo was sent to pharmacy. Pt was due for follow up this month and cancelled.  Please call pt and arrange follow up.

## 2012-08-18 NOTE — Telephone Encounter (Signed)
Informed patient that a 30 day supply has been sent to pharmacy and patient states that she will call back before month is out to schedule appointment. She states that she is waiting on her insurance to become effective.

## 2012-09-28 ENCOUNTER — Encounter: Payer: Self-pay | Admitting: Family

## 2012-09-28 ENCOUNTER — Other Ambulatory Visit: Payer: Self-pay | Admitting: Family

## 2012-09-28 ENCOUNTER — Ambulatory Visit (INDEPENDENT_AMBULATORY_CARE_PROVIDER_SITE_OTHER): Payer: Managed Care, Other (non HMO) | Admitting: Family

## 2012-09-28 VITALS — BP 104/80 | HR 73 | Temp 98.0°F | Resp 16 | Ht 63.5 in | Wt 218.1 lb

## 2012-09-28 DIAGNOSIS — Z8719 Personal history of other diseases of the digestive system: Secondary | ICD-10-CM

## 2012-09-28 DIAGNOSIS — R7989 Other specified abnormal findings of blood chemistry: Secondary | ICD-10-CM

## 2012-09-28 DIAGNOSIS — E785 Hyperlipidemia, unspecified: Secondary | ICD-10-CM

## 2012-09-28 DIAGNOSIS — R790 Abnormal level of blood mineral: Secondary | ICD-10-CM

## 2012-09-28 DIAGNOSIS — K625 Hemorrhage of anus and rectum: Secondary | ICD-10-CM

## 2012-09-28 DIAGNOSIS — G4700 Insomnia, unspecified: Secondary | ICD-10-CM

## 2012-09-28 LAB — CBC WITH DIFFERENTIAL/PLATELET
Basophils Absolute: 0 10*3/uL (ref 0.0–0.1)
Eosinophils Absolute: 0.2 10*3/uL (ref 0.0–0.7)
Eosinophils Relative: 3 % (ref 0–5)
Lymphs Abs: 3 10*3/uL (ref 0.7–4.0)
MCH: 32.3 pg (ref 26.0–34.0)
Neutrophils Relative %: 48 % (ref 43–77)
Platelets: 265 10*3/uL (ref 150–400)
RBC: 4.12 MIL/uL (ref 3.87–5.11)
RDW: 13.4 % (ref 11.5–15.5)
WBC: 7.6 10*3/uL (ref 4.0–10.5)

## 2012-09-28 LAB — IRON AND TIBC
%SAT: 47 % (ref 20–55)
TIBC: 356 ug/dL (ref 250–470)
UIBC: 187 ug/dL (ref 125–400)

## 2012-09-28 MED ORDER — AMITRIPTYLINE HCL 25 MG PO TABS: 25.0000 mg | ORAL_TABLET | Freq: Every day | ORAL | Status: DC

## 2012-09-28 MED ORDER — LANSOPRAZOLE 15 MG PO CPDR: 15.0000 mg | DELAYED_RELEASE_CAPSULE | Freq: Every day | ORAL | Status: DC

## 2012-09-28 MED ORDER — PITAVASTATIN CALCIUM 4 MG PO TABS: 1.0000 | ORAL_TABLET | Freq: Every day | ORAL | Status: DC

## 2012-09-28 NOTE — Patient Instructions (Addendum)
Please complete your blood work prior to leaving.   Complete stool kit and return. Work on quitting smoking. Follow up in 3 months.

## 2012-09-28 NOTE — Assessment & Plan Note (Signed)
Resume livalo. Plan to check flp, lft next visit.

## 2012-09-28 NOTE — Assessment & Plan Note (Signed)
She declines referral for colo at this time, but is agreeable to IFOB. If IFOB negative, it is reasonable to hold off on repeat colo given known hx of hemorrhoids.

## 2012-09-28 NOTE — Assessment & Plan Note (Signed)
Repeat iron level today.

## 2012-09-28 NOTE — Assessment & Plan Note (Signed)
Wants to avoid [place].   Has tried benadryl and melatonin.  Some tearfulness.  Will give trial of amitriptyline.

## 2012-09-28 NOTE — Assessment & Plan Note (Deleted)
She declines referral for colo at this time, but is agreeable to IFOB. If IFOB negative, it is reasonable to hold off on repeat colo given known hx of hemorrhoids.  

## 2012-09-28 NOTE — Progress Notes (Signed)
Subjective:    Patient ID: Gillermina Hu, female    DOB: [birth year], 56 y.o.   MRN: [id number]  HPI  Ms.  Desena is a 56 yr old female who presents today for follow up.  1) Rectal bleeding-  Last colo 2011.  Had apt with Dr. Christella Hartigan- did not go because of insurance lapse.  She reports that she has had a few episodes of rectal bleeding with which she believes are hemorrhoidal in nature. Was told on her colo 2011 that she has hemorrhoids on the inside and outside.    2) Elevated iron level- last visit her iron level was elevated. She denies taking any iron supplementation.  3) Hyperlipidemia- Had a lapse in her insurance.   livalo (last lipid panel in June was at goal).  4) Trouble sleeping- has been going on for a few weeks.  Woke up at 2:30 AM.  Denies caffeine.  Falls asleep.  When she wakes up she reports "feeling of dread." She has tried nyquil, melatonin, benadryl and advil PM.  Reports feeling irritable, emotional.      Review of Systems    see HPI  Past Medical History  Diagnosis Date  . History of chicken pox   . Genital warts   . Hypertension   . Personal history of colonic polyps   . Diverticulosis   . Osteopenia 02/18/2012    History   Social History  . Marital Status: Married    Spouse Name: N/A    Number of Children: 2  . Years of Education: N/A   Occupational History  . Not on file.   Social History Main Topics  . Smoking status: Current Every Day Smoker  . Smokeless tobacco: Never Used   Comment: 1 1/2 packs a week  . Alcohol Use: No  . Drug Use: Not on file  . Sexually Active: Not on file   Other Topics Concern  . 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 Abdominal hysterectomy 1999    partial    Family History  Problem Relation  Age of Onset  . Cancer Mother     lung  . Emphysema Father   . Stroke Father   . Seizures Father   . Heart disease Father   . Hypertension Father   . Cancer Maternal Aunt     colon    No Known Allergies  Current Outpatient Prescriptions on File Prior to Visit  Medication Sig Dispense Refill  . aspirin 81 MG tablet Take 324 mg by mouth daily.      . Calcium Carbonate-Vit D-Min (CALCIUM 1200 PO) Take 1 tablet by mouth daily.      . Cholecalciferol (VITAMIN D3) 2000 UNITS TABS Take 1 tablet by mouth daily.      Marland Kitchen DISCONTD: lansoprazole (PREVACID) 15 MG capsule Take 15 mg by mouth daily.      Marland Kitchen DISCONTD: LIVALO 4 MG TABS TAKE 1/2 TABLET BY MOUTH DAILY.  15 tablet  0  . amitriptyline (ELAVIL) 25 MG tablet Take 1 tablet (25 mg total) by mouth at bedtime.  30 tablet  2    BP 104/80  Pulse 73  Temp 98 F (36.7 C) (Oral)  Resp 16  Ht 5' 3.5" (1.613 m)  Wt 218 lb  1.9 oz (98.939 kg)  BMI 38.03 kg/m2  SpO2 98%    Objective:   Physical Exam  Constitutional: She is oriented to person, place, and time. She appears well-developed and well-nourished. No distress.  HENT:  Head: Normocephalic and atraumatic.  Cardiovascular: Normal rate and regular rhythm.   No murmur heard. Pulmonary/Chest: Effort normal and breath sounds normal. No respiratory distress. She has no wheezes. She has no rales. She exhibits no tenderness.  Abdominal: Soft. Bowel sounds are normal. She exhibits no distension and no mass. There is no tenderness. There is no rebound and no guarding.  Neurological: She is alert and oriented to person, place, and time.  Skin: Skin is warm and dry.  Psychiatric: She has a normal mood and affect. Her behavior is normal. Judgment and thought content normal.          Assessment & Plan:

## 2012-09-29 ENCOUNTER — Telehealth: Payer: Self-pay | Admitting: Family

## 2012-09-30 LAB — FERRITIN: Ferritin: 18 ng/mL (ref 10–291)

## 2012-10-04 ENCOUNTER — Encounter: Payer: Self-pay | Admitting: Family

## 2012-10-07 ENCOUNTER — Other Ambulatory Visit: Payer: Managed Care, Other (non HMO)

## 2012-10-07 LAB — FECAL OCCULT BLOOD, IMMUNOCHEMICAL: Fecal Occult Bld: NEGATIVE

## 2012-10-09 NOTE — Telephone Encounter (Signed)
Opened in error

## 2013-01-04 ENCOUNTER — Ambulatory Visit: Payer: Managed Care, Other (non HMO) | Admitting: Family

## 2013-01-04 ENCOUNTER — Other Ambulatory Visit: Payer: Self-pay | Admitting: Family

## 2013-01-04 NOTE — Telephone Encounter (Signed)
Amitriptyline request [Last Rx 10.07.13 #30x2]/SLS Please advise.

## 2013-01-05 ENCOUNTER — Encounter: Payer: Self-pay | Admitting: Family

## 2013-01-05 ENCOUNTER — Ambulatory Visit (INDEPENDENT_AMBULATORY_CARE_PROVIDER_SITE_OTHER): Payer: Managed Care, Other (non HMO) | Admitting: Family

## 2013-01-05 VITALS — BP 110/80 | HR 93 | Temp 98.9°F | Resp 18 | Ht 63.5 in | Wt 216.0 lb

## 2013-01-05 DIAGNOSIS — R790 Abnormal level of blood mineral: Secondary | ICD-10-CM

## 2013-01-05 DIAGNOSIS — G4700 Insomnia, unspecified: Secondary | ICD-10-CM

## 2013-01-05 DIAGNOSIS — F419 Anxiety disorder, unspecified: Secondary | ICD-10-CM

## 2013-01-05 DIAGNOSIS — F341 Dysthymic disorder: Secondary | ICD-10-CM

## 2013-01-05 DIAGNOSIS — F329 Major depressive disorder, single episode, unspecified: Secondary | ICD-10-CM

## 2013-01-05 DIAGNOSIS — R7989 Other specified abnormal findings of blood chemistry: Secondary | ICD-10-CM

## 2013-01-05 DIAGNOSIS — F32A Depression, unspecified: Secondary | ICD-10-CM | POA: Insufficient documentation

## 2013-01-05 DIAGNOSIS — E785 Hyperlipidemia, unspecified: Secondary | ICD-10-CM

## 2013-01-05 LAB — LIPID PANEL
Cholesterol: 200 mg/dL (ref 0–200)
LDL Cholesterol: 107 mg/dL — ABNORMAL HIGH (ref 0–99)
Triglycerides: 180 mg/dL — ABNORMAL HIGH (ref <150)

## 2013-01-05 LAB — HEPATIC FUNCTION PANEL
ALT: 17 U/L (ref 0–35)
Albumin: 4.6 g/dL (ref 3.5–5.2)
Indirect Bilirubin: 0.4 mg/dL (ref 0.0–0.9)
Total Protein: 7.2 g/dL (ref 6.0–8.3)

## 2013-01-05 MED ORDER — CITALOPRAM HYDROBROMIDE 20 MG PO TABS: 20.0000 mg | ORAL_TABLET | Freq: Every day | ORAL | Status: DC

## 2013-01-05 NOTE — Assessment & Plan Note (Signed)
Hopefully addition of citalopram will help with her sleep.

## 2013-01-05 NOTE — Patient Instructions (Addendum)
Citalopram- start 1/2 tablet by mouth daily for one week, then increase to a full tablet once daily on week two. Complete your lab work prior to leaving.  Follow up in 1 month.

## 2013-01-05 NOTE — Assessment & Plan Note (Signed)
Check iron level.  Ferritin was normal last visit. Check lft.

## 2013-01-05 NOTE — Assessment & Plan Note (Signed)
Has been taking livalo 4mg  for the last few weeks.  Obtain flp, lft.

## 2013-01-05 NOTE — Assessment & Plan Note (Signed)
I think pt could benefit from [facility].  Citalopram- I instructed pt to start 1/2 tablet once daily for 1 week and then increase to a full tablet once daily on week two as tolerated.  We discussed common side effects such as nausea, drowsiness and weight gain.  Also discussed rare but serious side effect of suicide ideation.  She is instructed to discontinue medication go directly to ED if this occurs.  Pt verbalizes understanding.  Plan follow up in 1 month to evaluate progress.

## 2013-01-05 NOTE — Progress Notes (Signed)
  Subjective:    Patient ID: Dawn Huber, female    DOB: Nov 19, 1956, 57 y.o.   MRN: [id number]  HPI  Dawn Huber is a 57 yr old female who presents today for follow up.  1) Hyperlipidemia- reports that she is currently taking livalo 2mg  once daily.  Denies myalgia.   2) Insomnia- no improvement with elavil.   3) Iron Excess- serum iron level noted to be mildly elevated with normal ferritin.    4) Tearful- can't get off of the couch.  Feels unmotivated. Wheels start turning.    Review of Systems See HPI  Past Medical History  Diagnosis Date  . History of chicken pox   . Genital warts   . Hypertension   . Personal history of colonic polyps   . Diverticulosis   . Osteopenia 02/18/2012    History   Social History  . Marital Status: Married    Spouse Name: N/A    Number of Children: 2  . Years of Education: N/A   Occupational History  . Not on file.   Social History Main Topics  . Smoking status: Current Every Day Smoker  . Smokeless tobacco: Never Used     Comment: 2 packs a week  . Alcohol Use: No  . Drug Use: Not on file  . Sexually Active: Not on file   Other Topics Concern  . 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 Abdominal hysterectomy 1999    partial    Family History  Problem Relation Age of Onset  . Cancer Mother     lung  . Emphysema Father   . Stroke Father   . Seizures Father   . Heart disease Father   . Hypertension Father   . Cancer Maternal Aunt     colon    No Known Allergies  Current Outpatient Prescriptions on File Prior to Visit  Medication Sig Dispense Refill  . aspirin 81 MG tablet Take 324 mg by mouth daily.      . lansoprazole (PREVACID) 15 MG capsule Take 1 capsule (15 mg total) by mouth daily.  30 capsule  5   . Pitavastatin Calcium (LIVALO) 4 MG TABS Take 1 tablet (4 mg total) by mouth daily.  30 tablet  5  . amitriptyline (ELAVIL) 25 MG tablet TAKE 1 TABLET (25 MG TOTAL) BY MOUTH AT BEDTIME.  30 tablet  2  . Calcium Carbonate-Vit D-Min (CALCIUM 1200 PO) Take 1 tablet by mouth daily.      . Cholecalciferol (VITAMIN D3) 2000 UNITS TABS Take 1 tablet by mouth daily.      . citalopram (CELEXA) 20 MG tablet Take 1 tablet (20 mg total) by mouth daily.  30 tablet  0    BP 110/80  Pulse 93  Temp 98.9 F (37.2 C) (Oral)  Resp 18  Ht 5' 3.5" (1.613 m)  Wt 216 lb (97.977 kg)  BMI 37.66 kg/m2  SpO2 99%       Objective:   Physical Exam        Assessment & Plan:

## 2013-02-06 ENCOUNTER — Other Ambulatory Visit: Payer: Self-pay

## 2013-02-15 ENCOUNTER — Ambulatory Visit: Payer: Managed Care, Other (non HMO) | Admitting: Family

## 2013-04-01 ENCOUNTER — Other Ambulatory Visit: Payer: Self-pay | Admitting: Family

## 2013-04-01 NOTE — Telephone Encounter (Signed)
Rx request to pharmacy/SLS  

## 2013-04-06 ENCOUNTER — Ambulatory Visit (INDEPENDENT_AMBULATORY_CARE_PROVIDER_SITE_OTHER): Payer: Managed Care, Other (non HMO) | Admitting: Family

## 2013-04-06 ENCOUNTER — Encounter: Payer: Self-pay | Admitting: Family

## 2013-04-06 VITALS — BP 102/72 | HR 67 | Temp 98.2°F | Ht 63.5 in | Wt 202.1 lb

## 2013-04-06 DIAGNOSIS — F329 Major depressive disorder, single episode, unspecified: Secondary | ICD-10-CM

## 2013-04-06 DIAGNOSIS — F341 Dysthymic disorder: Secondary | ICD-10-CM

## 2013-04-06 DIAGNOSIS — E785 Hyperlipidemia, unspecified: Secondary | ICD-10-CM

## 2013-04-06 DIAGNOSIS — K0510 Chronic gingivitis, plaque induced: Secondary | ICD-10-CM | POA: Insufficient documentation

## 2013-04-06 MED ORDER — AMOXICILLIN-POT CLAVULANATE 875-125 MG PO TABS: 1.0000 | ORAL_TABLET | Freq: Two times a day (BID) | ORAL | Status: DC

## 2013-04-06 NOTE — Patient Instructions (Addendum)
Please schedule fasting physical at the front desk. Arrange appointment with dental to evaluate your gums.

## 2013-04-06 NOTE — Assessment & Plan Note (Signed)
Improved off of meds.  Monitor.

## 2013-04-06 NOTE — Assessment & Plan Note (Signed)
LDL 107, continue livalo and low fat/low cholesterol diet.

## 2013-04-06 NOTE — Assessment & Plan Note (Signed)
?   Early dental abscess. Rx with augmentin.  Pt encouraged to make apt with dental ASAP.

## 2013-04-06 NOTE — Assessment & Plan Note (Signed)
Follow up iron levels were normal. Continue to avoid red meat.

## 2013-04-06 NOTE — Progress Notes (Signed)
Subjective:    Patient ID: Dawn Huber, female    DOB: 09-11-1956, 57 y.o.   MRN: [id number]  HPI  [name] is a 57 yr old female who presents today for follow up of her anxiety and depression. Last visit she was started on citalopram.  She reports that she took it only for a few weeks then stopped.  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  She feels well off of the medication.   Gum Pain- reports swelling and pain of the gums. She is requesting rx for abx until she can get in to see a dentist.  Review of Systems   see HPI  Past Medical History  Diagnosis Date  . History of chicken pox   . Genital warts   . Hypertension   . Personal history of colonic polyps   . Diverticulosis   . Osteopenia 02/18/2012    History   Social History  . Marital Status: Married    Spouse Name: N/A    Number of Children: 2  . Years of Education: N/A   Occupational History  . Not on file.   Social History Main Topics  . Smoking status: Current Every Day Smoker  . Smokeless tobacco: Never Used     Comment: 2 packs a week  . Alcohol Use: No  . Drug Use: Not on file  . Sexually Active: Not on file   Other Topics Concern  . Not on file   Social History Narrative   Regular exercise:  No, walks dog daily   Caffeine Use:  12oz pepsi daily   Works as a Scientist, research (medical).   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   Married              Past Surgical History  Procedure Laterality Date  . Abdominal hysterectomy  1999    partial    Family History  Problem Relation Age of Onset  . Cancer Mother     lung  . Emphysema Father   . Stroke Father   . Seizures Father   . Heart disease Father   . Hypertension Father   . Cancer Maternal Aunt     colon    No Known Allergies  Current Outpatient Prescriptions on File Prior to Visit  Medication Sig Dispense Refill  . aspirin 81 MG tablet  Take 324 mg by mouth daily.      . Calcium Carbonate-Vit D-Min (CALCIUM 1200 PO) Take 1 tablet by mouth daily.      . Cholecalciferol (VITAMIN D3) 2000 UNITS TABS Take 1 tablet by mouth daily.      . lansoprazole (PREVACID) 15 MG capsule TAKE 1 CAPSULE (15 MG TOTAL) BY MOUTH DAILY.  30 capsule  5  . LIVALO 4 MG TABS TAKE 1 TABLET (4 MG TOTAL) BY MOUTH DAILY.  30 tablet  5  . citalopram (CELEXA) 20 MG tablet Take 1 tablet (20 mg total) by mouth daily.  30 tablet  0   No current facility-administered medications on file prior to visit.    BP 102/72  Pulse 67  Temp(Src) 98.2 F (36.8 C) (Oral)  Ht 5' 3.5" (1.613 m)  Wt 202 lb 1.3 oz (91.663 kg)  BMI 35.23 kg/m2  SpO2 97%    Objective:   Physical Exam  Constitutional: She is oriented to person, place, and time. She appears well-developed and well-nourished.  No distress.  HENT:  Head: Normocephalic and atraumatic.  Some swelling noted around the base of the front lower teeth.  Cardiovascular: Normal rate and regular rhythm.   No murmur heard. Pulmonary/Chest: Effort normal and breath sounds normal. No respiratory distress. She has no wheezes. She has no rales. She exhibits no tenderness.  Neurological: She is alert and oriented to person, place, and time.  Skin: Skin is warm and dry.  Psychiatric: She has a normal mood and affect. Her behavior is normal. Judgment and thought content normal.          Assessment & Plan:

## 2013-04-20 ENCOUNTER — Ambulatory Visit: Payer: Managed Care, Other (non HMO) | Admitting: Family

## 2013-04-20 DIAGNOSIS — Z0289 Encounter for other administrative examinations: Secondary | ICD-10-CM

## 2013-10-18 ENCOUNTER — Other Ambulatory Visit: Payer: Self-pay | Admitting: Family

## 2013-10-18 NOTE — Telephone Encounter (Signed)
Left detailed message informing patient of medication refill and that she needs to call our office to schedule fasting cpe.

## 2013-10-18 NOTE — Telephone Encounter (Signed)
Pt was last seen in April and advised to schedule fasting CPE. Pt has no appts on file and has not completed CPE.  Please call pt to arrange appt.

## 2013-10-20 NOTE — Telephone Encounter (Signed)
Left message for patient to return my call.

## 2013-10-21 NOTE — Telephone Encounter (Signed)
Left message for patient to return my call.

## 2013-10-28 ENCOUNTER — Other Ambulatory Visit: Payer: Self-pay

## 2013-11-16 ENCOUNTER — Encounter: Payer: Self-pay | Admitting: Family

## 2013-11-16 ENCOUNTER — Ambulatory Visit (INDEPENDENT_AMBULATORY_CARE_PROVIDER_SITE_OTHER): Payer: Managed Care, Other (non HMO) | Admitting: Family

## 2013-11-16 VITALS — BP 106/80 | HR 77 | Temp 97.9°F | Resp 16 | Ht 63.5 in | Wt 213.0 lb

## 2013-11-16 DIAGNOSIS — K0510 Chronic gingivitis, plaque induced: Secondary | ICD-10-CM

## 2013-11-16 DIAGNOSIS — E785 Hyperlipidemia, unspecified: Secondary | ICD-10-CM

## 2013-11-16 DIAGNOSIS — F329 Major depressive disorder, single episode, unspecified: Secondary | ICD-10-CM

## 2013-11-16 DIAGNOSIS — F341 Dysthymic disorder: Secondary | ICD-10-CM

## 2013-11-16 LAB — HEPATIC FUNCTION PANEL
ALT: 15 U/L (ref 0–35)
AST: 18 U/L (ref 0–37)
Albumin: 4.2 g/dL (ref 3.5–5.2)
Total Bilirubin: 0.4 mg/dL (ref 0.3–1.2)

## 2013-11-16 LAB — IRON AND TIBC
%SAT: 16 % — ABNORMAL LOW (ref 20–55)
TIBC: 392 ug/dL (ref 250–470)
UIBC: 330 ug/dL (ref 125–400)

## 2013-11-16 LAB — LIPID PANEL
HDL: 60 mg/dL (ref >39)
Total CHOL/HDL Ratio: 2.7 Ratio

## 2013-11-16 LAB — FERRITIN: Ferritin: 2 ng/mL — ABNORMAL LOW (ref 10–291)

## 2013-11-16 MED ORDER — LANSOPRAZOLE 15 MG PO CPDR: DELAYED_RELEASE_CAPSULE | ORAL | Status: DC

## 2013-11-16 MED ORDER — PITAVASTATIN CALCIUM 4 MG PO TABS: ORAL_TABLET | ORAL | Status: DC

## 2013-11-16 NOTE — Assessment & Plan Note (Signed)
Continue lival, obtain Lipids/lft.

## 2013-11-16 NOTE — Assessment & Plan Note (Signed)
Stable off meds.  Monitor.  

## 2013-11-16 NOTE — Assessment & Plan Note (Signed)
Reports that she has a partial denture, has had all but 3 teeth removed due to peridontal disease.

## 2013-11-16 NOTE — Assessment & Plan Note (Signed)
Continue iron level, continue to avoid red meat.

## 2013-11-16 NOTE — Patient Instructions (Signed)
Please complete lab work this AM. Follow up in 6 months.

## 2013-11-16 NOTE — Progress Notes (Signed)
Subjective:    Patient ID: Dawn Huber, female    DOB: 1956-07-25, 57 y.o.   MRN: [id number]  HPI  Dawn Huber is a 57 yr old female who presents today for follow up.  1) anxiety/depression- not currently on meds.  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 Sleeping well, reports that her anxiety and depression are well controled.   2) Hyperlipidemia- maintained on livalo. Denies myalgia. Lab Results  Component Value Date   CHOL 200 01/05/2013   HDL 57 01/05/2013   LDLCALC 107* 01/05/2013   TRIG 180* 01/05/2013   CHOLHDL 3.5 01/05/2013   Lab Results  Component Value Date   ALT 17 01/05/2013   AST 20 01/05/2013   ALKPHOS 103 01/05/2013   BILITOT 0.5 01/05/2013    3) Iron excess-  Lab Results  Component Value Date   IRON 139 01/05/2013   TIBC 356 09/28/2012   FERRITIN 14 01/05/2013   4) rash- saw dr. Vernell Barrier- not has rash on the right hand. Using otc psoriasis cream.    5) tobacco abuse- stopped cigarettes.  Now vaping, trying to wean down.    Review of Systems    see hpi  Past Medical History  Diagnosis Date  . History of chicken pox   . Genital warts   . Hypertension   . Personal history of colonic polyps   . Diverticulosis   . Osteopenia 02/18/2012    History   Social History  . Marital Status: Married    Spouse Name: N/A    Number of Children: 2  . Years of Education: N/A   Occupational History  . Not on file.   Social History Main Topics  . Smoking status: Former Smoker    Quit date: 08/16/2013  . Smokeless tobacco: Never Used     Comment: Pt states she "vapes".  . Alcohol Use: No  . Drug Use: Not on file  . Sexual Activity: Not on file   Other Topics Concern  . Not on file   Social History Narrative   Regular exercise:  No, walks dog daily   Caffeine Use:  12oz pepsi daily   Works as a Scientist, research (medical).   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   Married              Past Surgical History  Procedure  Laterality Date  . Abdominal hysterectomy  1999    partial    Family History  Problem Relation Age of Onset  . Cancer Mother     lung  . Emphysema Father   . Stroke Father   . Seizures Father   . Heart disease Father   . Hypertension Father   . Cancer Maternal Aunt     colon    No Known Allergies  Current Outpatient Prescriptions on File Prior to Visit  Medication Sig Dispense Refill  . aspirin 81 MG tablet Take 324 mg by mouth daily.      . Calcium Carbonate-Vit D-Min (CALCIUM 1200 PO) Take 1 tablet by mouth daily.      . Cholecalciferol (VITAMIN D3) 2000 UNITS TABS Take 1 tablet by mouth daily.      . lansoprazole (PREVACID) 15 MG capsule TAKE 1 CAPSULE BY MOUTH DAILY.  30 capsule  3  . LIVALO 4 MG TABS TAKE 1 TABLET BY MOUTH DAILY.  30 tablet  3   No current facility-administered medications on file prior to visit.    BP 106/80  Pulse 77  Temp(Src) 97.9 F (36.6 C) (Oral)  Resp 16  Ht 5' 3.5" (1.613 m)  Wt 213 lb 0.6 oz (96.634 kg)  BMI 37.14 kg/m2  SpO2 99%    Objective:   Physical Exam  Constitutional: She is oriented to person, place, and time. She appears well-developed and well-nourished. No distress.  HENT:  Head: Normocephalic and atraumatic.  Cardiovascular: Normal rate and regular rhythm.   No murmur heard. Pulmonary/Chest: Effort normal and breath sounds normal. No respiratory distress. She has no wheezes. She has no rales. She exhibits no tenderness.  Neurological: She is alert and oriented to person, place, and time.  Skin: Skin is warm and dry.  Mild eczematous rash noted on right hand  Psychiatric: She has a normal mood and affect. Her behavior is normal. Judgment and thought content normal.          Assessment & Plan:

## 2014-05-17 ENCOUNTER — Ambulatory Visit: Payer: Managed Care, Other (non HMO) | Admitting: Family

## 2014-05-17 DIAGNOSIS — Z0289 Encounter for other administrative examinations: Secondary | ICD-10-CM

## 2014-08-15 ENCOUNTER — Other Ambulatory Visit: Payer: Self-pay | Admitting: Family

## 2014-08-15 NOTE — Telephone Encounter (Signed)
Rx request to pharmacy/SLS PATIENT DUE FOR FOLLOW-UP OFFICE VISIT  

## 2014-08-17 ENCOUNTER — Ambulatory Visit (INDEPENDENT_AMBULATORY_CARE_PROVIDER_SITE_OTHER): Payer: Managed Care, Other (non HMO) | Admitting: Family

## 2014-08-17 ENCOUNTER — Encounter: Payer: Self-pay | Admitting: Family

## 2014-08-17 VITALS — BP 116/84 | HR 73 | Temp 98.2°F | Resp 16 | Ht 63.5 in

## 2014-08-17 DIAGNOSIS — F329 Major depressive disorder, single episode, unspecified: Secondary | ICD-10-CM

## 2014-08-17 DIAGNOSIS — F419 Anxiety disorder, unspecified: Secondary | ICD-10-CM

## 2014-08-17 DIAGNOSIS — E785 Hyperlipidemia, unspecified: Secondary | ICD-10-CM

## 2014-08-17 DIAGNOSIS — F341 Dysthymic disorder: Secondary | ICD-10-CM

## 2014-08-17 LAB — CBC WITH DIFFERENTIAL/PLATELET
Basophils Absolute: 0.1 10*3/uL (ref 0.0–0.1)
Basophils Relative: 1 % (ref 0–1)
Eosinophils Absolute: 0.1 10*3/uL (ref 0.0–0.7)
Eosinophils Relative: 1 % (ref 0–5)
HEMATOCRIT: 30.4 % — AB (ref 36.0–46.0)
HEMOGLOBIN: 9.7 g/dL — AB (ref 12.0–15.0)
LYMPHS ABS: 2.6 10*3/uL (ref 0.7–4.0)
Lymphocytes Relative: 40 % (ref 12–46)
MCH: 25.3 pg — AB (ref 26.0–34.0)
MCHC: 31.9 g/dL (ref 30.0–36.0)
MCV: 79.2 fL (ref 78.0–100.0)
MONOS PCT: 7 % (ref 3–12)
Monocytes Absolute: 0.5 10*3/uL (ref 0.1–1.0)
NEUTROS PCT: 51 % (ref 43–77)
Neutro Abs: 3.3 10*3/uL (ref 1.7–7.7)
Platelets: 330 10*3/uL (ref 150–400)
RBC: 3.84 MIL/uL — AB (ref 3.87–5.11)
RDW: 16.7 % — ABNORMAL HIGH (ref 11.5–15.5)
WBC: 6.5 10*3/uL (ref 4.0–10.5)

## 2014-08-17 LAB — HEPATIC FUNCTION PANEL
ALK PHOS: 70 U/L (ref 39–117)
ALT: 14 U/L (ref 0–35)
AST: 19 U/L (ref 0–37)
Albumin: 4.4 g/dL (ref 3.5–5.2)
BILIRUBIN INDIRECT: 0.3 mg/dL (ref 0.2–1.2)
BILIRUBIN TOTAL: 0.4 mg/dL (ref 0.2–1.2)
Bilirubin, Direct: 0.1 mg/dL (ref 0.0–0.3)
TOTAL PROTEIN: 7 g/dL (ref 6.0–8.3)

## 2014-08-17 LAB — LIPID PANEL
CHOL/HDL RATIO: 2.9 ratio
Cholesterol: 169 mg/dL (ref 0–200)
HDL: 58 mg/dL (ref >39)
LDL CALC: 82 mg/dL (ref 0–99)
Triglycerides: 143 mg/dL (ref <150)
VLDL: 29 mg/dL (ref 0–40)

## 2014-08-17 LAB — FERRITIN: Ferritin: 4 ng/mL — ABNORMAL LOW (ref 10–291)

## 2014-08-17 LAB — IRON: IRON: 28 ug/dL — AB (ref 42–145)

## 2014-08-17 MED ORDER — ZOLPIDEM TARTRATE 5 MG PO TABS: 5.0000 mg | ORAL_TABLET | Freq: Every evening | ORAL | Status: DC | PRN

## 2014-08-17 MED ORDER — PITAVASTATIN CALCIUM 4 MG PO TABS: ORAL_TABLET | ORAL | Status: DC

## 2014-08-17 NOTE — Assessment & Plan Note (Signed)
Tolerating statin, obtain flp/lft, continue livalo.

## 2014-08-17 NOTE — Progress Notes (Signed)
Pre visit review using our clinic review tool, if applicable. No additional management support is needed unless otherwise documented below in the visit note. 

## 2014-08-17 NOTE — Assessment & Plan Note (Signed)
Recent increased stress with care of her mother. Would like rx for sleep. Attempt trial of prn ambien.

## 2014-08-17 NOTE — Assessment & Plan Note (Signed)
She continues to avoid red meat.  Obtain serum iron, ferritin, cbc.

## 2014-08-17 NOTE — Progress Notes (Signed)
Subjective:    Patient ID: Dawn Huber, female    DOB: Nov 01, 1956, 58 y.o.   MRN: [id number]  HPI  Dawn Huber is a 58 yr old female who presents today for  follow up. She was last seen in November 2014.  1) Hyperlipidemia- Last visit her lipids were at goal on livalo 4mg .   Lab Results  Component Value Date   CHOL 163 11/16/2013   HDL 60 11/16/2013   LDLCALC 77 11/16/2013   TRIG 128 11/16/2013   CHOLHDL 2.7 11/16/2013   2) Anxiety/Depression- staying with her 24 yr old mom who is in hospice.  Recently quit her job. Trouble sleeping at night. Wakes up and snacks and has been gaining weight.  Would like something to help her sleep. She denies current depression symptoms.  3) Hx of iron excess- she reports that she continue to avoid red meat.  Review of Systems    see HPI  Past Medical History  Diagnosis Date  . History of chicken pox   . Genital warts   . Hypertension   . Personal history of colonic polyps   . Diverticulosis   . Osteopenia 02/18/2012    History   Social History  . Marital Status: Married    Spouse Name: N/A    Number of Children: 2  . Years of Education: N/A   Occupational History  . Not on file.   Social History Main Topics  . Smoking status: Former Smoker    Quit date: 08/16/2013  . Smokeless tobacco: Never Used     Comment: Pt states she "vapes".  . Alcohol Use: No  . Drug Use: Not on file  . Sexual Activity: Not on file   Other Topics Concern  . Not on file   Social History Narrative   Regular exercise:  No, walks dog daily   Caffeine Use:  12oz pepsi daily   Works as a Haematologist.   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   Married              Past Surgical History  Procedure Laterality Date  . Abdominal hysterectomy  1999    partial    Family History  Problem Relation Age of Onset  . Cancer Mother     lung  . Emphysema Father   . Stroke Father   . Seizures Father   . Heart  disease Father   . Hypertension Father   . Cancer Maternal Aunt     colon    Allergies  Allergen Reactions  . Amoxicillin Other (See Comments)    Itching of hands and feet    Current Outpatient Prescriptions on File Prior to Visit  Medication Sig Dispense Refill  . aspirin 81 MG tablet Take 324 mg by mouth daily.      . Calcium Carbonate-Vit D-Min (CALCIUM 1200 PO) Take 1 tablet by mouth daily.      . Cholecalciferol (VITAMIN D3) 2000 UNITS TABS Take 1 tablet by mouth daily.      . lansoprazole (PREVACID) 15 MG capsule TAKE 1 CAPSULE BY MOUTH DAILY.  30 capsule  5  . LIVALO 4 MG TABS TAKE 1 TABLET BY MOUTH DAILY.  30 tablet  0   No current facility-administered medications on file prior to visit.    BP 116/84  Pulse 73  Temp(Src) 98.2 F (36.8 C) (Oral)  Resp 16  Ht 5' 3.5" (1.613 m)  SpO2 99%  Objective:   Physical Exam  Constitutional: She is oriented to person, place, and time. She appears well-developed and well-nourished. No distress.  Cardiovascular: Normal rate and regular rhythm.   No murmur heard. Pulmonary/Chest: Effort normal and breath sounds normal. No respiratory distress. She has no wheezes. She has no rales. She exhibits no tenderness.  Neurological: She is alert and oriented to person, place, and time.  Psychiatric:  Tearful upon discussion of her mom's health          Assessment & Plan:

## 2014-08-17 NOTE — Patient Instructions (Addendum)
Please complete lab work prior to leaving.  Please schedule physical at the front desk.

## 2014-08-18 ENCOUNTER — Encounter: Payer: Self-pay | Admitting: Family

## 2014-08-18 ENCOUNTER — Telehealth: Payer: Self-pay | Admitting: Family

## 2014-08-18 DIAGNOSIS — D649 Anemia, unspecified: Secondary | ICD-10-CM

## 2014-08-18 NOTE — Telephone Encounter (Signed)
Lab work shows anemia and mildly low iron level.  I would like her to complete IFOB (dx anemia) and make sure there is no blood in stool. After I review this result I will decide if we should add in an iron supplement.

## 2014-08-24 ENCOUNTER — Telehealth: Payer: Self-pay | Admitting: Family

## 2014-08-24 NOTE — Telephone Encounter (Signed)
Caller name: Chyla  Relation to pt: self  Call back number: [phone] Pharmacy:  Reason for call:   Patient states that she sent in the stool kit but she forgot to put in her name and dob. She states that she did put her return address on envelope.

## 2014-08-24 NOTE — Telephone Encounter (Signed)
Notified pt it would be best to re-collect specimen. New kit placed at front desk and already labeled with pt's name, etc. . . .  Pt voices understanding.

## 2014-09-15 ENCOUNTER — Other Ambulatory Visit: Payer: Self-pay | Admitting: Family

## 2014-09-15 ENCOUNTER — Telehealth: Payer: Self-pay | Admitting: Family

## 2014-09-15 NOTE — Telephone Encounter (Signed)
Pt states that Melissa informed her to call back if she needed to increase the mg for rx zolpidem (AMBIEN) 5 MG tablet if it was not strong enough pt is requesting an increase. Pt also would like to the results of her stool sample test, that was done over a month ago states she mailed it in but has not heard anything.

## 2014-09-16 MED ORDER — ZOLPIDEM TARTRATE 10 MG PO TABS: 10.0000 mg | ORAL_TABLET | Freq: Every evening | ORAL | Status: DC | PRN

## 2014-09-16 NOTE — Telephone Encounter (Signed)
Could you please contact the lab and confirm that they did not receive specimen.  If not, then we will need her to repeat IFOB please.  See rx for ambien 10mg .

## 2014-09-16 NOTE — Telephone Encounter (Signed)
IFob still showing as future without collection from 08.28.15; please advise on medication request/SLS

## 2014-09-19 ENCOUNTER — Encounter: Payer: Self-pay | Admitting: Family

## 2014-09-20 NOTE — Telephone Encounter (Signed)
Per verbal from lab, they have no record of receiving IFOB kit from pt. Notified pt and she states she returned 2nd kit around 08/30/14. Advised pt of need for evaluation in the clinic for CBC and hemoccult. Pt scheduled appt for 09/21/14 at 1:15pm

## 2014-09-21 ENCOUNTER — Encounter: Payer: Self-pay | Admitting: Family

## 2014-09-21 ENCOUNTER — Ambulatory Visit (INDEPENDENT_AMBULATORY_CARE_PROVIDER_SITE_OTHER): Payer: Managed Care, Other (non HMO) | Admitting: Family

## 2014-09-21 VITALS — BP 117/72 | HR 81 | Temp 98.1°F | Resp 18 | Ht 63.5 in | Wt 216.2 lb

## 2014-09-21 DIAGNOSIS — D649 Anemia, unspecified: Secondary | ICD-10-CM

## 2014-09-21 DIAGNOSIS — R5383 Other fatigue: Secondary | ICD-10-CM

## 2014-09-21 DIAGNOSIS — F329 Major depressive disorder, single episode, unspecified: Secondary | ICD-10-CM

## 2014-09-21 DIAGNOSIS — R5381 Other malaise: Secondary | ICD-10-CM

## 2014-09-21 DIAGNOSIS — J309 Allergic rhinitis, unspecified: Secondary | ICD-10-CM

## 2014-09-21 DIAGNOSIS — N61 Mastitis without abscess: Secondary | ICD-10-CM | POA: Insufficient documentation

## 2014-09-21 DIAGNOSIS — F341 Dysthymic disorder: Secondary | ICD-10-CM

## 2014-09-21 DIAGNOSIS — F419 Anxiety disorder, unspecified: Secondary | ICD-10-CM

## 2014-09-21 LAB — CBC WITH DIFFERENTIAL/PLATELET
Basophils Absolute: 0 10*3/uL (ref 0.0–0.1)
Basophils Relative: 0.4 % (ref 0.0–3.0)
EOS PCT: 0.6 % (ref 0.0–5.0)
Eosinophils Absolute: 0.1 10*3/uL (ref 0.0–0.7)
HCT: 33.1 % — ABNORMAL LOW (ref 36.0–46.0)
HEMOGLOBIN: 10.3 g/dL — AB (ref 12.0–15.0)
Lymphocytes Relative: 37.1 % (ref 12.0–46.0)
Lymphs Abs: 3.3 10*3/uL (ref 0.7–4.0)
MCHC: 31.1 g/dL (ref 30.0–36.0)
MCV: 80.8 fl (ref 78.0–100.0)
MONOS PCT: 7.9 % (ref 3.0–12.0)
Monocytes Absolute: 0.7 10*3/uL (ref 0.1–1.0)
NEUTROS ABS: 4.8 10*3/uL (ref 1.4–7.7)
Neutrophils Relative %: 54 % (ref 43.0–77.0)
Platelets: 312 10*3/uL (ref 150.0–400.0)
RBC: 4.1 Mil/uL (ref 3.87–5.11)
RDW: 16.5 % — ABNORMAL HIGH (ref 11.5–15.5)
WBC: 8.9 10*3/uL (ref 4.0–10.5)

## 2014-09-21 LAB — TSH: TSH: 0.2 u[IU]/mL — AB (ref 0.35–4.50)

## 2014-09-21 MED ORDER — CEPHALEXIN 500 MG PO CAPS: 500.0000 mg | ORAL_CAPSULE | Freq: Two times a day (BID) | ORAL | Status: DC

## 2014-09-21 MED ORDER — FERROUS FUMARATE 325 (106 FE) MG PO TABS: 1.0000 | ORAL_TABLET | Freq: Two times a day (BID) | ORAL | Status: DC

## 2014-09-21 NOTE — Patient Instructions (Addendum)
Please complete lab work prior to leaving.  Please consider contacting our [place] office to start seeing a therapist. Start iron supplement twice daily. You can try adding zyrtec 10mg  once daily for allergy symptoms.  Follow up in 1 month, sooner if problems/concerns.

## 2014-09-21 NOTE — Assessment & Plan Note (Signed)
Suspect infected cyst/abscess with early cellulitis.  Rx with keflex and then will re-evaluate in 1 week. If not resolved, will proceed with further breast imaging at that time. She is instructed to contact us if more swelling, redness, pain, or fever occurs.

## 2014-09-21 NOTE — Progress Notes (Signed)
Pre visit review using our clinic review tool, if applicable. No additional management support is needed unless otherwise documented below in the visit note. 

## 2014-09-21 NOTE — Assessment & Plan Note (Signed)
We discussed addition of an antidepressant, she declines.  She will consider therapy referral and I gave her the contact info.

## 2014-09-21 NOTE — Assessment & Plan Note (Signed)
Heme negative in the office today. Add iron supplement. Repeat cbc today to ensure that it is stable.  Will also check TSH due to fatigue. She reports that she has constipation with iron. Recommended colace daily or miralax daily to prevent constipation.

## 2014-09-21 NOTE — Progress Notes (Addendum)
Subjective:    Patient ID: Dawn Huber, female    DOB: 04-02-1956, 58 y.o.   MRN: [id number]  HPI  Dawn Huber is a 58 yr old female who presents today for follow up of her fatigue.  1) Anemia- noted last visit.   Denies black or blood stools. Iron was noted to be low last visit.  2) Depression. Mom died October 08, 2023.  Notes + fatigue, tired, tearful often.  Upset with her family's behavior following her mother's death.   3) Nasal congestion- started sneezing yesterday and developed nasal congestion.  4) R breast pain- reports hx of "cyst in my right breast."  Has had tenderness x 1 week.  Review of Systems See HPI  Reports mild lower abd cramping, like mild menstrual cramps  Past Medical History  Diagnosis Date  . History of chicken pox   . Genital warts   . Hypertension   . Personal history of colonic polyps   . Diverticulosis   . Osteopenia 02/18/2012    History   Social History  . Marital Status: Married    Spouse Name: N/A    Number of Children: 2  . Years of Education: N/A   Occupational History  . Not on file.   Social History Main Topics  . Smoking status: Former Smoker    Quit date: 08/16/2013  . Smokeless tobacco: Never Used     Comment: Pt states she "vapes".  . Alcohol Use: No  . Drug Use: Not on file  . Sexual Activity: Not on file   Other Topics Concern  . Not on file   Social History Narrative   Regular exercise:  No, walks dog daily   Caffeine Use:  12oz pepsi daily   Works as a Haematologist.   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   Married              Past Surgical History  Procedure Laterality Date  . Abdominal hysterectomy  1999    partial    Family History  Problem Relation Age of Onset  . Cancer Mother     lung  . Emphysema Father   . Stroke Father   . Seizures Father   . Heart disease Father   . Hypertension Father   . Cancer Maternal Aunt     colon    Allergies  Allergen Reactions    . Amoxicillin Other (See Comments)    Itching of hands and feet    Current Outpatient Prescriptions on File Prior to Visit  Medication Sig Dispense Refill  . aspirin 81 MG tablet Take 324 mg by mouth daily.      . Calcium Carbonate-Vit D-Min (CALCIUM 1200 PO) Take 1 tablet by mouth daily.      . Cholecalciferol (VITAMIN D3) 2000 UNITS TABS Take 1 tablet by mouth daily.      . lansoprazole (PREVACID) 15 MG capsule TAKE 1 CAPSULE BY MOUTH DAILY.  30 capsule  5  . Pitavastatin Calcium (LIVALO) 4 MG TABS TAKE 1 TABLET BY MOUTH DAILY.  30 tablet  5  . zolpidem (AMBIEN) 10 MG tablet Take 1 tablet (10 mg total) by mouth at bedtime as needed for sleep.  30 tablet  0   No current facility-administered medications on file prior to visit.    BP 117/72  Pulse 81  Temp(Src) 98.1 F (36.7 C) (Oral)  Resp 18  Ht 5' 3.5" (1.613 m)  Wt 216 lb 3.2  oz (98.068 kg)  BMI 37.69 kg/m2  SpO2 100%       Objective:   Physical Exam  Constitutional: She is oriented to person, place, and time. She appears well-developed and well-nourished. No distress.  Cardiovascular: Normal rate and regular rhythm.   No murmur heard. Pulmonary/Chest: Effort normal and breath sounds normal. No respiratory distress. She has no wheezes. She has no rales. She exhibits no tenderness.  Neurological: She is alert and oriented to person, place, and time.  Psychiatric: Her behavior is normal. Judgment and thought content normal.  tearful  R breast- tender mass at 7 oclock, some surrounding erythema noted above the mass.         Assessment & Plan:

## 2014-09-21 NOTE — Assessment & Plan Note (Signed)
Trial of Zyrtec. 

## 2014-09-22 ENCOUNTER — Telehealth: Payer: Self-pay | Admitting: Family

## 2014-09-22 DIAGNOSIS — E0590 Thyrotoxicosis, unspecified without thyrotoxic crisis or storm: Secondary | ICD-10-CM

## 2014-09-22 NOTE — Telephone Encounter (Signed)
Please contact pt and let her know that her thyroid testing is showing mildly overactive thyroid.  I would like her to complete an ultrasound of her thyroid to further evaluate.  Also, could you please request lab to add on free t3 and free t4 dx hyperthyroid? Thanks.

## 2014-09-23 NOTE — Telephone Encounter (Signed)
Notified pt and she is agreeable to proceed with u/s. She will return to the lab on Monday for free t3 and free t4. Lab orders entered.

## 2014-09-26 ENCOUNTER — Other Ambulatory Visit (INDEPENDENT_AMBULATORY_CARE_PROVIDER_SITE_OTHER): Payer: Self-pay

## 2014-09-26 ENCOUNTER — Telehealth: Payer: Self-pay | Admitting: Family

## 2014-09-26 ENCOUNTER — Ambulatory Visit (INDEPENDENT_AMBULATORY_CARE_PROVIDER_SITE_OTHER): Payer: Self-pay

## 2014-09-26 DIAGNOSIS — E0590 Thyrotoxicosis, unspecified without thyrotoxic crisis or storm: Secondary | ICD-10-CM

## 2014-09-26 DIAGNOSIS — E041 Nontoxic single thyroid nodule: Secondary | ICD-10-CM

## 2014-09-26 LAB — T4, FREE: Free T4: 0.82 ng/dL (ref 0.60–1.60)

## 2014-09-26 LAB — T3, FREE: T3, Free: 3.1 pg/mL (ref 2.3–4.2)

## 2014-09-26 NOTE — Telephone Encounter (Signed)
See my chart message

## 2014-09-28 ENCOUNTER — Encounter: Payer: Self-pay | Admitting: Family

## 2014-09-28 ENCOUNTER — Ambulatory Visit (INDEPENDENT_AMBULATORY_CARE_PROVIDER_SITE_OTHER): Payer: Self-pay | Admitting: Family

## 2014-09-28 VITALS — BP 123/72 | HR 77 | Temp 98.3°F | Resp 16 | Ht 63.5 in | Wt 219.8 lb

## 2014-09-28 DIAGNOSIS — N63 Unspecified lump in breast: Secondary | ICD-10-CM

## 2014-09-28 DIAGNOSIS — N61 Inflammatory disorders of breast: Secondary | ICD-10-CM

## 2014-09-28 DIAGNOSIS — N6310 Unspecified lump in the right breast, unspecified quadrant: Secondary | ICD-10-CM

## 2014-09-28 NOTE — Assessment & Plan Note (Signed)
Pt completed abx and notes area to be less tender.  Mass which was noted last week in the breast is smaller in size and less tender. Will proceed with diagnostic mammogram and [place] to further evaluate.

## 2014-09-28 NOTE — Patient Instructions (Signed)
You will be contacted about your breast imaging.  Please follow up in 3 months, sooner if problems/concerns.

## 2014-09-28 NOTE — Progress Notes (Signed)
Pre visit review using our clinic review tool, if applicable. No additional management support is needed unless otherwise documented below in the visit note. 

## 2014-09-28 NOTE — Progress Notes (Signed)
Subjective:    Patient ID: Dawn Huber, female    DOB: [birth year], 57 y.o.   MRN: [id number]  HPI  Dawn Huber is a 58 year old female who presents today for follow up of her right breast cellulitis.  She was given Keflex one week ago.  She completed her prescription this morning and reports that the right breast is no longer painful, no longer weeping fluid.  She denies fever and reports feeling like she has more energy.  She would like to set up her mammogram. .  Review of Systems  Constitutional: Negative for fever and fatigue.  Respiratory: Negative for shortness of breath.   Neurological: Negative.    Past Medical History  Diagnosis Date  . History of chicken pox   . Genital warts   . Hypertension   . Personal history of colonic polyps   . Diverticulosis   . Osteopenia 02/18/2012    History   Social History  . Marital Status: Married    Spouse Name: N/A    Number of Children: 2  . Years of Education: N/A   Occupational History  . Not on file.   Social History Main Topics  . Smoking status: Former Smoker    Quit date: 08/16/2013  . Smokeless tobacco: Never Used     Comment: Pt states she "vapes".  . Alcohol Use: No  . Drug Use: Not on file  . Sexual Activity: Not on file   Other Topics Concern  . Not on file   Social History Narrative   Regular exercise:  No, walks dog daily   Caffeine Use:  12oz pepsi daily   Works as a Haematologist.   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   Married              Past Surgical History  Procedure Laterality Date  . Abdominal hysterectomy  1999    partial    Family History  Problem Relation Age of Onset  . Cancer Mother     lung  . Emphysema Father   . Stroke Father   . Seizures Father   . Heart disease Father   . Hypertension Father   . Cancer Maternal Aunt     colon    Allergies  Allergen Reactions  . Amoxicillin Other (See Comments)    Itching of hands and feet     Current Outpatient Prescriptions on File Prior to Visit  Medication Sig Dispense Refill  . aspirin 81 MG tablet Take 324 mg by mouth daily.      . Calcium Carbonate-Vit D-Min (CALCIUM 1200 PO) Take 1 tablet by mouth daily.      . Cholecalciferol (VITAMIN D3) 2000 UNITS TABS Take 1 tablet by mouth daily.      . ferrous fumarate (HEMOCYTE - 106 MG FE) 325 (106 FE) MG TABS tablet Take 1 tablet (106 mg of iron total) by mouth 2 (two) times daily.  30 each  0  . lansoprazole (PREVACID) 15 MG capsule TAKE 1 CAPSULE BY MOUTH DAILY.  30 capsule  5  . Pitavastatin Calcium (LIVALO) 4 MG TABS TAKE 1 TABLET BY MOUTH DAILY.  30 tablet  5  . zolpidem (AMBIEN) 10 MG tablet Take 1 tablet (10 mg total) by mouth at bedtime as needed for sleep.  30 tablet  0   No current facility-administered medications on file prior to visit.    BP 123/72  Pulse 77  Temp(Src) 98.3  F (36.8 C) (Oral)  Resp 16  Ht 5' 3.5" (1.613 m)  Wt 219 lb 12.8 oz (99.701 kg)  BMI 38.32 kg/m2  SpO2 99%        Objective:   Physical Exam  Constitutional: She is oriented to person, place, and time. She appears well-developed and well-nourished.  HENT:  Head: Normocephalic and atraumatic.  Cardiovascular: Normal rate, regular rhythm and normal heart sounds.   No murmur heard. Pulmonary/Chest: Effort normal and breath sounds normal. No respiratory distress. Right breast exhibits no inverted nipple, no nipple discharge, no skin change and no tenderness. Left breast exhibits no inverted nipple, no mass, no nipple discharge, no skin change and no tenderness.  Pea sized mildly tender mass beneath right areoloa at [facility]  Neurological: She is alert and oriented to person, place, and time.  Skin: Skin is warm and dry. No rash noted. No erythema.  Psychiatric: She has a normal mood and affect. Her behavior is normal. Thought content normal.          Assessment & Plan:  Will set up her diagnostic mammogram and ultrasound with  the breast center to further evaluate.   I have personally seen and examined patient and agree with Jettie Booze NP student's assessment and plan.

## 2014-10-03 ENCOUNTER — Ambulatory Visit
Admission: RE | Admit: 2014-10-03 | Discharge: 2014-10-03 | Disposition: A | Discharge status: Home / Self Care | Payer: Commercial Indemnity | Source: Ambulatory Visit | Attending: Family | Admitting: Family

## 2014-10-03 DIAGNOSIS — N6310 Unspecified lump in the right breast, unspecified quadrant: Secondary | ICD-10-CM

## 2014-10-05 ENCOUNTER — Encounter: Payer: Self-pay | Admitting: Family

## 2014-10-14 ENCOUNTER — Telehealth: Payer: Self-pay | Admitting: *Deleted

## 2014-10-14 MED ORDER — ZOLPIDEM TARTRATE 10 MG PO TABS: 10.0000 mg | ORAL_TABLET | Freq: Every evening | ORAL | Status: DC | PRN

## 2014-10-14 NOTE — Telephone Encounter (Signed)
Rx faxed to pharmacy  

## 2014-10-14 NOTE — Telephone Encounter (Signed)
Received refill request from [facility] for zolpidem 10mg .  Last rx printed 09/16/14. Pt has f/u in 12/2014. Rx printed and forwarded to Provider for signature.

## 2014-10-20 NOTE — Telephone Encounter (Signed)
She is now ready to schedule. Could you please help facilitate?

## 2014-10-20 NOTE — Telephone Encounter (Signed)
Dawn Huber, could you please follow up on this with endo and contact the patient?

## 2014-10-20 NOTE — Telephone Encounter (Signed)
Pt did not schedule appt with Endocrinology. She told them she wanted to speak with you first.

## 2014-11-02 ENCOUNTER — Ambulatory Visit (INDEPENDENT_AMBULATORY_CARE_PROVIDER_SITE_OTHER): Payer: Commercial Indemnity | Admitting: Internal Medicine

## 2014-11-02 ENCOUNTER — Encounter: Payer: Self-pay | Admitting: Internal Medicine

## 2014-11-02 VITALS — BP 104/68 | HR 92 | Temp 98.3°F | Resp 12 | Ht 63.0 in | Wt 220.0 lb

## 2014-11-02 DIAGNOSIS — R7989 Other specified abnormal findings of blood chemistry: Secondary | ICD-10-CM

## 2014-11-02 DIAGNOSIS — R946 Abnormal results of thyroid function studies: Secondary | ICD-10-CM

## 2014-11-02 LAB — TSH: TSH: 1.1 u[IU]/mL (ref 0.35–4.50)

## 2014-11-02 LAB — T3, FREE: T3, Free: 3.4 pg/mL (ref 2.3–4.2)

## 2014-11-02 LAB — T4, FREE: FREE T4: 0.81 ng/dL (ref 0.60–1.60)

## 2014-11-02 NOTE — Patient Instructions (Addendum)
Please stop at the lab.  Please come back for a follow-up appointment in 6 months.  Please let me know if you develop the following symptoms: Hyperthyroidism The thyroid is a large gland located in the lower front part of your neck. The thyroid helps control metabolism. Metabolism is how your body uses food. It controls metabolism with the hormone thyroxine. When the thyroid is overactive, it produces too much hormone. When this happens, these following problems may occur:   Nervousness  Heat intolerance  Weight loss (in spite of increase food intake)  Diarrhea  Change in hair or skin texture  Palpitations (heart skipping or having extra beats)  Tachycardia (rapid heart rate)  Loss of menstruation (amenorrhea)  Shaking of the hands CAUSES  Grave's Disease (the immune system attacks the thyroid gland). This is the most common cause.  Inflammation of the thyroid gland.  Tumor (usually benign) in the thyroid gland or elsewhere.  Excessive use of thyroid medications (both prescription and 'natural').  Excessive ingestion of Iodine. DIAGNOSIS  To prove hyperthyroidism, your caregiver may do blood tests and ultrasound tests. Sometimes the signs are hidden. It may be necessary for your caregiver to watch this illness with blood tests, either before or after diagnosis and treatment. TREATMENT Short-term treatment There are several treatments to control symptoms. Drugs called beta blockers may give some relief. Drugs that decrease hormone production will provide temporary relief in many people. These measures will usually not give permanent relief. Definitive therapy There are treatments available which can be discussed between you and your caregiver which will permanently treat the problem. These treatments range from surgery (removal of the thyroid), to the use of radioactive iodine (destroys the thyroid by radiation), to the use of antithyroid drugs (interfere with hormone  synthesis). The first two treatments are permanent and usually successful. They most often require hormone replacement therapy for life. This is because it is impossible to remove or destroy the exact amount of thyroid required to make a person euthyroid (normal). HOME CARE INSTRUCTIONS  See your caregiver if the problems you are being treated for get worse. Examples of this would be the problems listed above. SEEK MEDICAL CARE IF: Your general condition worsens. MAKE SURE YOU:   Understand these instructions.  Will watch your condition.  Will get help right away if you are not doing well or get worse. Document Released: 12/09/2005 Document Revised: 03/02/2012 Document Reviewed: 04/22/2007 [facility] Patient Information 2015 [place], [place]. This information is not intended to replace advice given to you by your health care provider. Make sure you discuss any questions you have with your health care provider.

## 2014-11-02 NOTE — Progress Notes (Signed)
Patient ID: Dawn Huber, female   DOB: March 18, 1956, 58 y.o.   MRN: [id number]   HPI  Dawn Huber is a 58 y.o.-year-old female, referred by her PCP,O'SULLIVAN,MELISSA S., NP, for management of subclinical hyperthyroidism.  I reviewed pt's thyroid tests - she had 1 low TSH value: Lab Results  Component Value Date   TSH 0.20* 09/21/2014   TSH 3.772 01/20/2012   FREET4 0.82 09/26/2014    Pt c/o: - + excessive sweating/heat intolerance - no tremors - no anxiety - no palpitations - + fatigue - not sleeping well, better after Ambien added - no hyperdefecation - no weight loss  Pt denies feeling nodules in neck, hoarseness, dysphagia/odynophagia, SOB with lying down.  Recent thyroid U/S (09/26/2014) - small cysts:   Right thyroid lobe: 4.5 x 1.6 x 1.4 cm. Heterogeneous thyroid echotexture. Small hypoechoic midpole tiny thyroid nodule only measures 5 mm.  Left thyroid lobe: 4.6 x 1.7 x 1.5 cm. Small hypoechoic thyroid cyst measures 8 mm. No other significant thyroid abnormality.  Isthmus Thickness: 4 mm. No nodules visualized.  Lymphadenopathy: None visualized. IMPRESSION: 5 mm right midpole thyroid nodule 8 mm left upper pole thyroid cyst  Pt does have a FH of thyroid ds.: goiter in father. No FH of thyroid cancer. No h/o radiation tx to head or neck.  No seaweed or kelp, no recent contrast studies. No recent steroid use. No herbal supplements.   I reviewed her chart and she also has a history of anemia, acid reflux, HL.  ROS: Constitutional: see above Eyes: + occasional blurry vision, no xerophthalmia ENT: + sore throat, no nodules palpated in throat, no dysphagia/odynophagia, no hoarseness Cardiovascular: no CP/SOB/palpitations/leg swelling Respiratory: no cough/SOB Gastrointestinal: no N/V/D/C Musculoskeletal: no muscle/+ joint aches Skin: no rashes Neurological: no tremors/numbness/tingling/dizziness, + HA Psychiatric: no depression/anxiety + low  libido  Past Medical History  Diagnosis Date  . History of chicken pox   . Genital warts   . Hypertension   . Personal history of colonic polyps   . Diverticulosis   . Osteopenia 02/18/2012   Past Surgical History  Procedure Laterality Date  . Abdominal hysterectomy  1999    partial   History   Social History  . Marital Status: Married    Spouse Name: N/A    Number of Children: 2   Social History Main Topics  . Smoking status: Former Smoker    Quit date: 2015  . Smokeless tobacco: Never Used     Comment: Pt states she "vapes".  . Alcohol Use: No  . Drug Use: No   Social History Narrative   Works as a Arboriculturist   She has 2 grown children and 4 grandchildren- family lives locally.   Married    Current Outpatient Prescriptions on File Prior to Visit  Medication Sig Dispense Refill  . aspirin 81 MG tablet Take 324 mg by mouth daily.    . Calcium Carbonate-Vit D-Min (CALCIUM 1200 PO) Take 1 tablet by mouth daily.    . Cholecalciferol (VITAMIN D3) 2000 UNITS TABS Take 1 tablet by mouth daily.    . ferrous fumarate (HEMOCYTE - 106 MG FE) 325 (106 FE) MG TABS tablet Take 1 tablet (106 mg of iron total) by mouth 2 (two) times daily. 30 each 0  . lansoprazole (PREVACID) 15 MG capsule TAKE 1 CAPSULE BY MOUTH DAILY. 30 capsule 5  . Pitavastatin Calcium (LIVALO) 4 MG TABS TAKE 1 TABLET BY MOUTH DAILY. 30 tablet 5  . zolpidem (  AMBIEN) 10 MG tablet Take 1 tablet (10 mg total) by mouth at bedtime as needed for sleep. 30 tablet 0   No current facility-administered medications on file prior to visit.   Allergies  Allergen Reactions  . Amoxicillin Other (See Comments)    Itching of hands and feet   Family History  Problem Relation Age of Onset  . Cancer Mother     lung  . Emphysema Father   . Stroke Father   . Seizures Father   . Heart disease Father   . Hypertension Father   . Cancer Maternal Aunt     colon   PE: BP 104/68 mmHg  Pulse 92  Temp(Src) 98.3 F (36.8  C) (Oral)  Resp 12  Ht 5\' 3"  (1.6 m)  Wt 220 lb (99.791 kg)  BMI 38.98 kg/m2  SpO2 97% Wt Readings from Last 3 Encounters:  11/02/14 220 lb (99.791 kg)  09/28/14 219 lb 12.8 oz (99.701 kg)  09/21/14 216 lb 3.2 oz (98.068 kg)   Constitutional: overweight, in NAD Eyes: PERRLA, EOMI, no exophthalmos, no lid lag, no stare ENT: moist mucous membranes, no thyromegaly, no thyroid bruits, no cervical lymphadenopathy Cardiovascular: RRR, No MRG Respiratory: CTA B Gastrointestinal: abdomen soft, NT, ND, BS+ Musculoskeletal: no deformities, strength intact in all 4 Skin: dry skin, warm, no rashes Neurological: no tremor with outstretched hands, DTR normal in all 4  ASSESSMENT: 1. Subclinical hyperthyroidism - Low TSH x1  2. Small thyroid cysts  PLAN:  1. Patient with a recently found low TSH, without thyrotoxic sxs: no weight loss, hyperdefecation, palpitations, anxiety. She does have hot flushes, though. - she does not appear to have exogenous causes for the low TSH.  - We discussed that possible causes of thyrotoxicosis are:  Graves ds   Thyroiditis toxic multinodular goiter/ toxic adenoma (no nodules on recent thyroid U/S). - I suggested that we check the TSH, fT3 and fT4  - If the tests remain abnormal, we may need an uptake and scan to differentiate between the 3 above possible etiologies  - we discussed about possible modalities of treatment for the above conditions, to include methimazole use, radioactive iodine ablation or surgery. - RTC in 6 months, but likely sooner for repeat labs  2. Thyroid cysts - I reassured the pt that subcm thyroid cysts are very common and very low risk for cancer - no repeat U/S needed  - time spent with the patient: 45 min, of which >50% was spent in obtaining information about her symptoms, reviewing her previous labs, evaluations, images, counseling her about her condition (please see the discussed topics above), and developing a plan to further  investigate it. She had a number of questions which I addressed.   Office Visit on 11/02/2014  Component Date Value Ref Range Status  . T3, Free 11/02/2014 3.4  2.3 - 4.2 pg/mL Final  . TSH 11/02/2014 1.10  0.35 - 4.50 uIU/mL Final  . Free T4 11/02/2014 0.81  0.60 - 1.60 ng/dL Final   Thyroid tests have normalized. I would like to recheck them in 6 months, but she does not need an office visit at that time. We will just schedule this is a lab appointment if the labs are normal, she will continue to follow with primary care doctor, with a TSH checked every year.

## 2014-11-16 ENCOUNTER — Telehealth: Payer: Self-pay | Admitting: *Deleted

## 2014-11-16 MED ORDER — ZOLPIDEM TARTRATE 10 MG PO TABS: 10.0000 mg | ORAL_TABLET | Freq: Every evening | ORAL | Status: DC | PRN

## 2014-11-16 NOTE — Telephone Encounter (Signed)
Received call from pt requesting refill of zolpidem. Rx called to [facility] at [place], #30 x no refills.

## 2014-12-13 ENCOUNTER — Other Ambulatory Visit: Payer: Self-pay | Admitting: Family

## 2014-12-13 NOTE — Telephone Encounter (Signed)
Rx called to [place] at [facility].

## 2014-12-13 NOTE — Telephone Encounter (Signed)
Pt has f/u 04/04/15.  Please advise zolpidem refill:  Medication name:  Name from pharmacy:  zolpidem (AMBIEN) 10 MG tablet ZOLPIDEM TARTRATE 10 MG TAB 10 MG TAB     Sig: TAKE 1 TABLET BY MOUTH AT BEDTIME AS NEEDED FOR SLEEP    Dispense: 30 tablet   Refills: 0   Start: 12/13/2014   Class: Normal    Requested on: 11/16/2014    Originally ordered on: 09/16/2014 11/16/2014

## 2014-12-13 NOTE — Telephone Encounter (Signed)
Ok to send 30 tabs zero refills. 

## 2014-12-23 HISTORY — PX: COLONOSCOPY: SHX174

## 2015-01-02 ENCOUNTER — Ambulatory Visit: Payer: Managed Care, Other (non HMO) | Admitting: Family

## 2015-01-03 ENCOUNTER — Ambulatory Visit: Payer: Managed Care, Other (non HMO) | Admitting: Family

## 2015-01-17 ENCOUNTER — Telehealth: Payer: Self-pay | Admitting: *Deleted

## 2015-01-17 MED ORDER — ZOLPIDEM TARTRATE 10 MG PO TABS: 10.0000 mg | ORAL_TABLET | Freq: Every evening | ORAL | Status: DC | PRN

## 2015-01-17 NOTE — Telephone Encounter (Signed)
Caller name: [place] Relation to pt: self Call back number: [phone] Pharmacy: [facility]  Reason for call:  Pt came in office stating pharmacy sent request for refill on zolpidem (AMBIEN) 10 MG tablet yesterday by fax. Pharmacy stated 10 minutes ago they have not received anything back.  Pt was wondering if there was any way RX could be sent downstairs while she is here.

## 2015-01-17 NOTE — Telephone Encounter (Signed)
Received request via cover my meds for PA on livalo. Unable to reach pt to determine meds tried and failed other than pravastatin and atorvastatin per pt report due to myalgia prior to establishing care with [place]. Information submitted. Awaiting approval / denial status.

## 2015-01-17 NOTE — Telephone Encounter (Signed)
Refill called to [facility] at [facility].

## 2015-01-17 NOTE — Telephone Encounter (Signed)
Last zolpidem refill 12/13/14, #30 x no refills.  Pt has follow up in April. Please advise.

## 2015-01-17 NOTE — Telephone Encounter (Signed)
Ok to send 30 tabs zero refills. 

## 2015-01-17 NOTE — Telephone Encounter (Signed)
Notified pt. 

## 2015-01-18 NOTE — Telephone Encounter (Signed)
Patient is returning your call.  

## 2015-01-25 NOTE — Telephone Encounter (Signed)
Spoke with prior auth representative at [facility] and was told they received PA request from [place] and it is still in clinical review.

## 2015-01-27 ENCOUNTER — Telehealth: Payer: Self-pay | Admitting: *Deleted

## 2015-01-27 NOTE — Telephone Encounter (Signed)
Opened in error

## 2015-01-27 NOTE — Telephone Encounter (Signed)
Received call from prior auth rep from [facility] wanting to know past medication tries and failures. Info was given. She stated it was still processing and we would receive a faxed determination in 48-72 hours. She stated the reference number is [id number].  JG//CMA

## 2015-01-30 NOTE — Telephone Encounter (Signed)
Received approval via cover my meds for Livalo. Approval faxed to pharmacy.  Notified pt and pharmacy.  [name] (Key: [id number]) [phone]  Need help? Call us at [phone]  *ARCHIVED*  Outcome: Approved  Drug: Livalo 4MG  tablets  Form: Step Therapy Form for Advicor, Altoprev, Crestor, Lescol XL, Liptruzet, Livalo, Simcor, and Vytorin  Phone: [phone]  Fax: [phone]

## 2015-02-15 IMAGING — US US SOFT TISSUE HEAD/NECK
1 series · 14 of 25 positions shown · non-contrast
Comparison: None.

CLINICAL DATA: Hyperthyroidism, abnormal thyroid function tests,
initial encounter

EXAM:
THYROID ULTRASOUND
TECHNIQUE: Ultrasound examination of the thyroid gland and adjacent soft
tissues was performed.

[Series 1: us soft tissue head/neck · 0.06mm/px · 14 of 41 slices shown]
[im 1/41]
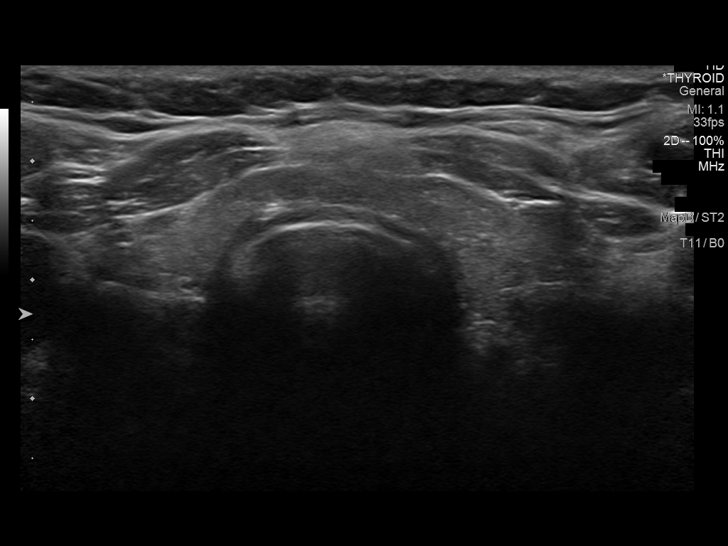
[im 4/41]
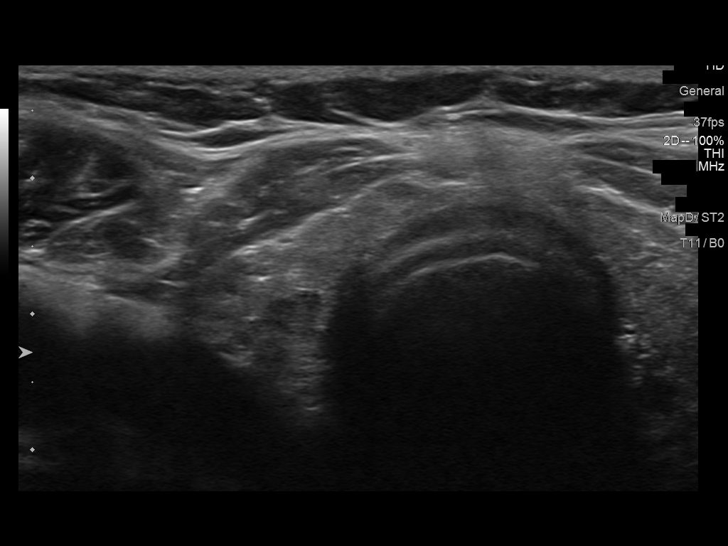
[im 7/41]
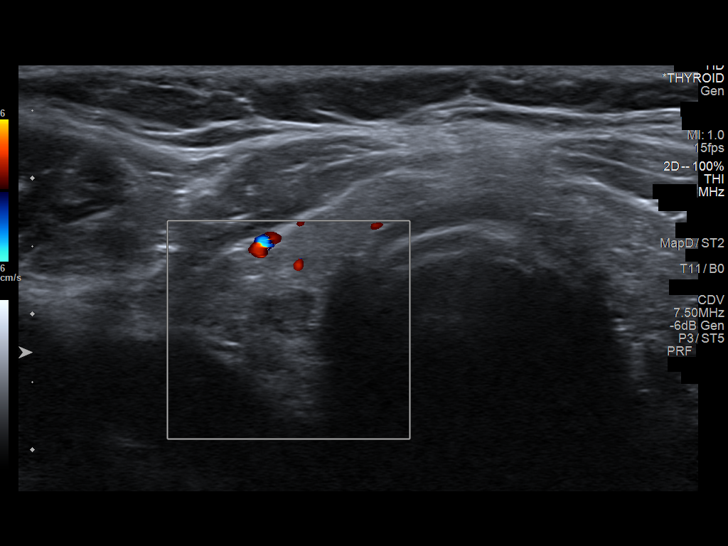
[im 11/41]
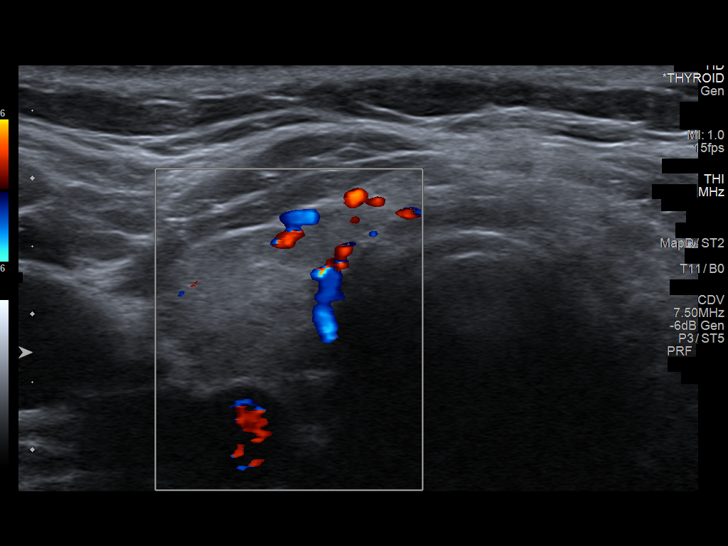
[im 14/41]
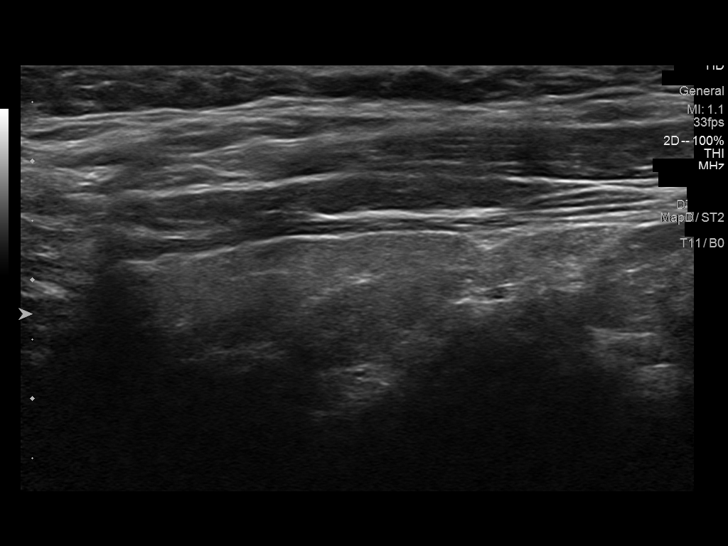
[im 16/41]
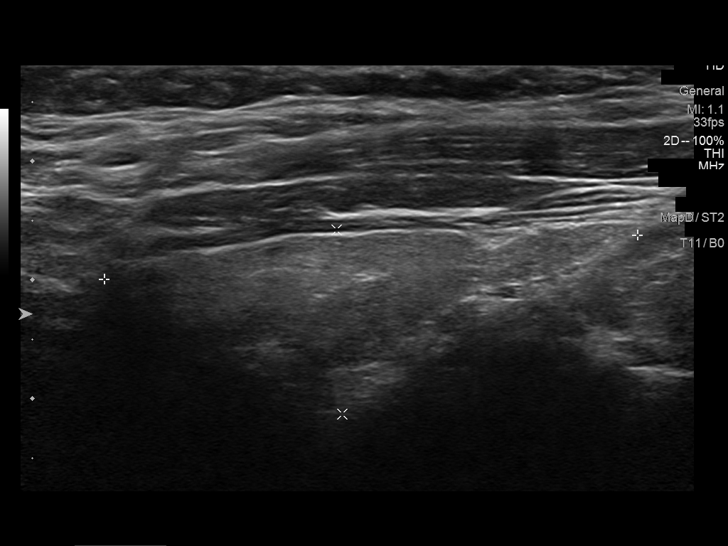
[im 19/41]
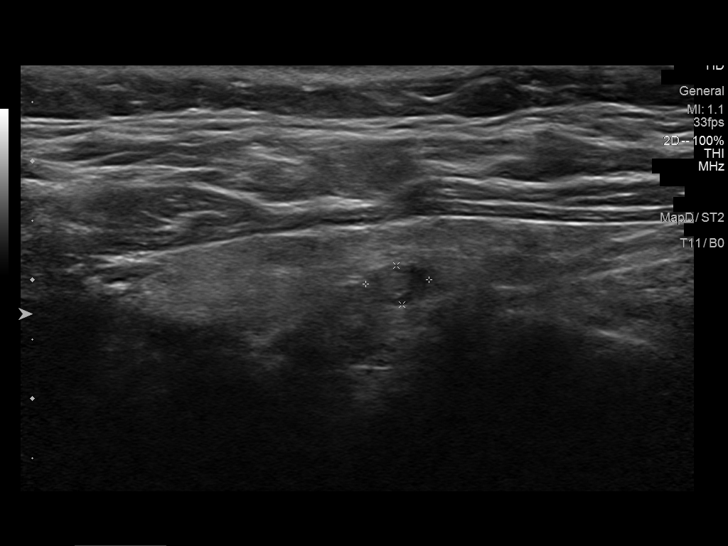
[im 22/41]
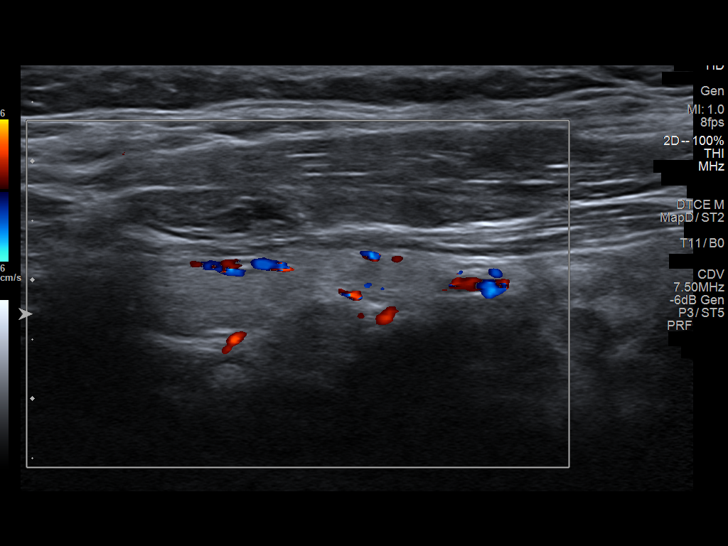
[im 26/41]
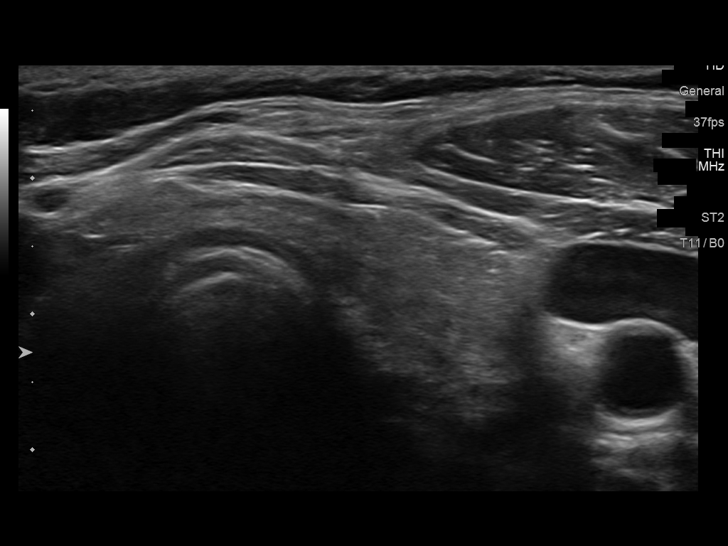
[im 27/41]
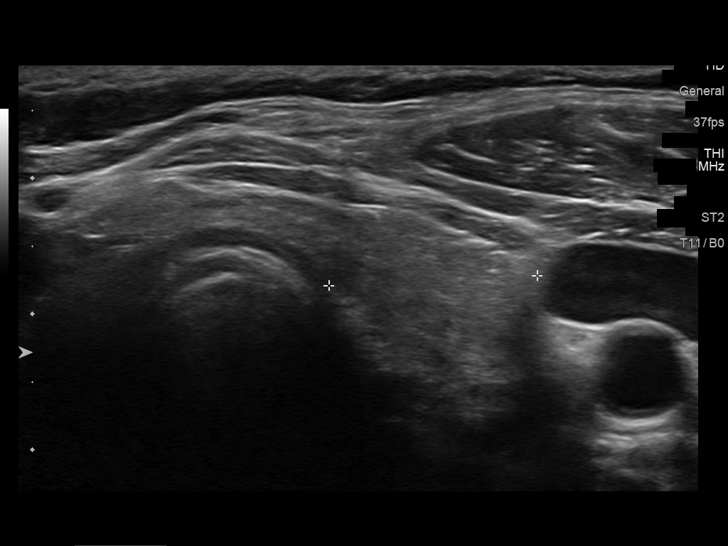
[im 31/41]
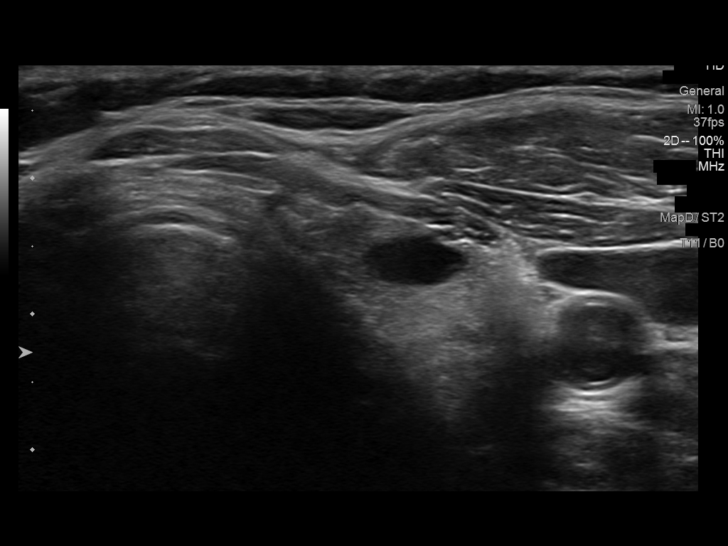
[im 34/41]
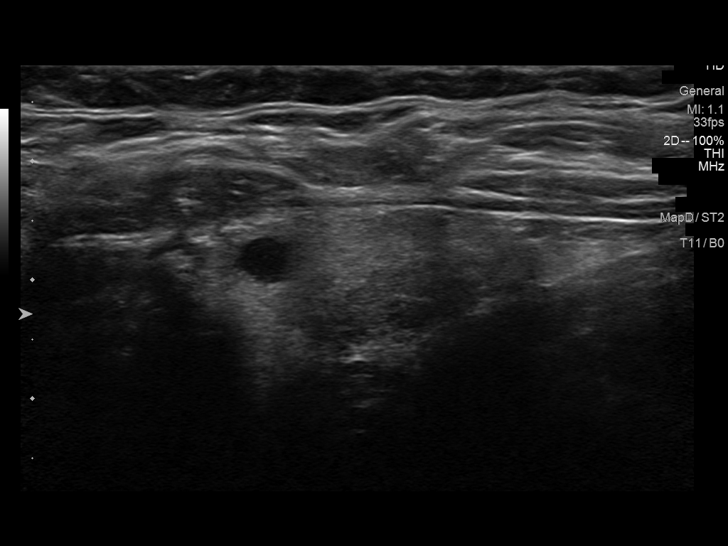
[im 37/41]
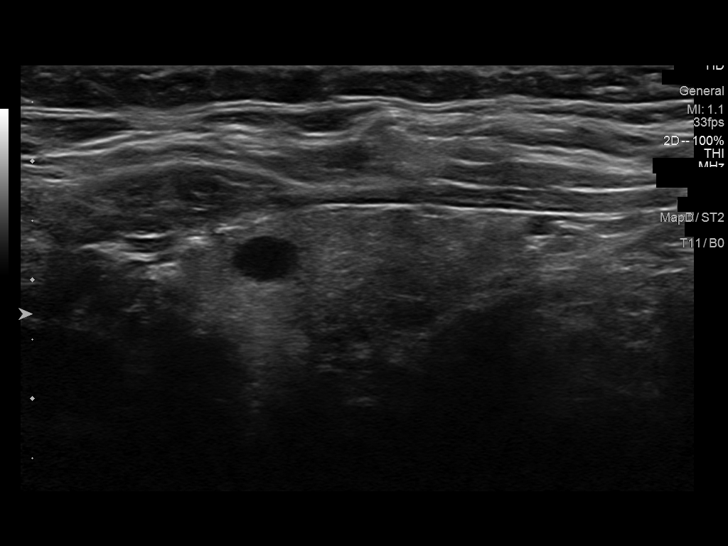
[im 41/41]
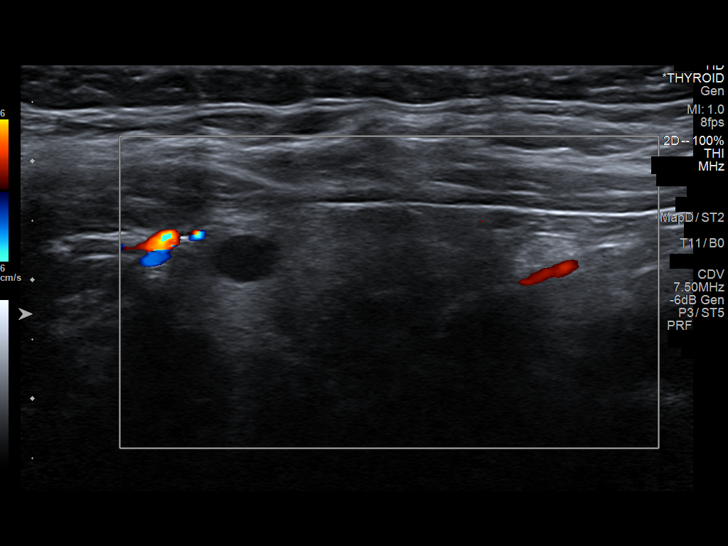

[14 of 25 positions shown; findings below may reference images not displayed]

FINDINGS: Right thyroid lobe

Measurements: 4.5 x 1.6 x 1.4 cm. Heterogeneous thyroid echotexture.
Small hypoechoic midpole tiny thyroid nodule only measures 5 mm.

Left thyroid lobe

Measurements: 4.6 x 1.7 x 1.5 cm. Small hypoechoic thyroid cyst
measures 8 mm. No other significant thyroid abnormality.

Isthmus

Thickness: 4 mm.  No nodules visualized.

Lymphadenopathy

None visualized.
IMPRESSION: 5 mm right midpole thyroid nodule

8 mm left upper pole thyroid cyst

Findings do not meet current SRU consensus criteria for biopsy.
Follow-up by clinical exam is recommended. If patient has known risk
factors for thyroid carcinoma, consider follow-up ultrasound in 12
months. If patient is clinically hyperthyroid, consider nuclear
medicine thyroid uptake and scan.Reference: Management of Thyroid
Nodules Detected at US: Society of Radiologists in Ultrasound

## 2015-02-16 ENCOUNTER — Telehealth: Payer: Self-pay | Admitting: *Deleted

## 2015-02-16 MED ORDER — ZOLPIDEM TARTRATE 10 MG PO TABS: 10.0000 mg | ORAL_TABLET | Freq: Every evening | ORAL | Status: DC | PRN

## 2015-02-16 NOTE — Telephone Encounter (Signed)
Rx faxed to pharmacy  

## 2015-02-16 NOTE — Telephone Encounter (Signed)
Fax request from [facility] for zolpidem 10mg . Last rx 01/17/15.  Rx printed and forwarded to Provider for signature.

## 2015-03-13 ENCOUNTER — Telehealth: Payer: Self-pay | Admitting: *Deleted

## 2015-03-13 ENCOUNTER — Other Ambulatory Visit: Payer: Self-pay | Admitting: Family

## 2015-03-13 MED ORDER — ZOLPIDEM TARTRATE 10 MG PO TABS: 10.0000 mg | ORAL_TABLET | Freq: Every evening | ORAL | Status: DC | PRN

## 2015-03-13 NOTE — Telephone Encounter (Signed)
Pt has f/u with PCP 04/04/15. Last Rx 02/16/15.  Rx printed and forwarded to Provider for signature.

## 2015-03-14 NOTE — Telephone Encounter (Signed)
rx signed

## 2015-03-14 NOTE — Telephone Encounter (Signed)
Rx faxed to pharmacy  

## 2015-04-04 ENCOUNTER — Ambulatory Visit: Payer: Commercial Indemnity | Admitting: Family

## 2015-04-10 ENCOUNTER — Encounter: Payer: Self-pay | Admitting: Family

## 2015-04-10 ENCOUNTER — Ambulatory Visit (INDEPENDENT_AMBULATORY_CARE_PROVIDER_SITE_OTHER): Payer: Managed Care, Other (non HMO) | Admitting: Family

## 2015-04-10 DIAGNOSIS — R7989 Other specified abnormal findings of blood chemistry: Secondary | ICD-10-CM | POA: Diagnosis not present

## 2015-04-10 DIAGNOSIS — G4700 Insomnia, unspecified: Secondary | ICD-10-CM

## 2015-04-10 DIAGNOSIS — D649 Anemia, unspecified: Secondary | ICD-10-CM

## 2015-04-10 DIAGNOSIS — E785 Hyperlipidemia, unspecified: Secondary | ICD-10-CM | POA: Diagnosis not present

## 2015-04-10 DIAGNOSIS — E0590 Thyrotoxicosis, unspecified without thyrotoxic crisis or storm: Secondary | ICD-10-CM

## 2015-04-10 DIAGNOSIS — R945 Abnormal results of liver function studies: Secondary | ICD-10-CM

## 2015-04-10 LAB — CBC WITH DIFFERENTIAL/PLATELET
Basophils Absolute: 0 10*3/uL (ref 0.0–0.1)
Basophils Relative: 0.6 % (ref 0.0–3.0)
EOS PCT: 2.1 % (ref 0.0–5.0)
Eosinophils Absolute: 0.1 10*3/uL (ref 0.0–0.7)
Hemoglobin: 8.4 g/dL — ABNORMAL LOW (ref 12.0–15.0)
Lymphocytes Relative: 39.7 % (ref 12.0–46.0)
Lymphs Abs: 2.1 10*3/uL (ref 0.7–4.0)
MCHC: 31.4 g/dL (ref 30.0–36.0)
MCV: 70.5 fl — ABNORMAL LOW (ref 78.0–100.0)
Monocytes Absolute: 0.4 10*3/uL (ref 0.1–1.0)
Monocytes Relative: 7.1 % (ref 3.0–12.0)
Neutro Abs: 2.7 10*3/uL (ref 1.4–7.7)
Neutrophils Relative %: 50.5 % (ref 43.0–77.0)
PLATELETS: 271 10*3/uL (ref 150.0–400.0)
RBC: 3.78 Mil/uL — ABNORMAL LOW (ref 3.87–5.11)
RDW: 18.9 % — AB (ref 11.5–15.5)
WBC: 5.4 10*3/uL (ref 4.0–10.5)

## 2015-04-10 LAB — IRON AND TIBC
%SAT: 4 % — ABNORMAL LOW (ref 20–55)
Iron: 14 ug/dL — ABNORMAL LOW (ref 42–145)
TIBC: 376 ug/dL (ref 250–470)
UIBC: 362 ug/dL (ref 125–400)

## 2015-04-10 LAB — HEPATIC FUNCTION PANEL
ALK PHOS: 69 U/L (ref 39–117)
ALT: 14 U/L (ref 0–35)
AST: 16 U/L (ref 0–37)
Albumin: 4 g/dL (ref 3.5–5.2)
BILIRUBIN DIRECT: 0 mg/dL (ref 0.0–0.3)
BILIRUBIN TOTAL: 0.3 mg/dL (ref 0.2–1.2)
Total Protein: 6.7 g/dL (ref 6.0–8.3)

## 2015-04-10 LAB — LIPID PANEL
Cholesterol: 157 mg/dL (ref 0–200)
HDL: 54.8 mg/dL (ref >39.00)
LDL CALC: 79 mg/dL (ref 0–99)
NonHDL: 102.2
TRIGLYCERIDES: 116 mg/dL (ref 0.0–149.0)
Total CHOL/HDL Ratio: 3
VLDL: 23.2 mg/dL (ref 0.0–40.0)

## 2015-04-10 LAB — FERRITIN: FERRITIN: 2.9 ng/mL — AB (ref 10.0–291.0)

## 2015-04-10 MED ORDER — ZOLPIDEM TARTRATE 10 MG PO TABS: 10.0000 mg | ORAL_TABLET | Freq: Every evening | ORAL | Status: DC | PRN

## 2015-04-10 MED ORDER — LANSOPRAZOLE 15 MG PO CPDR: 15.0000 mg | DELAYED_RELEASE_CAPSULE | Freq: Every day | ORAL | Status: DC

## 2015-04-10 MED ORDER — PITAVASTATIN CALCIUM 4 MG PO TABS: ORAL_TABLET | ORAL | Status: DC

## 2015-04-10 NOTE — Assessment & Plan Note (Addendum)
Stable on ambien, continue same. A controlled substance contract will be signed today and UDS ordered.

## 2015-04-10 NOTE — Progress Notes (Signed)
Pre visit review using our clinic review tool, if applicable. No additional management support is needed unless otherwise documented below in the visit note. 

## 2015-04-10 NOTE — Assessment & Plan Note (Signed)
Obtain follow up CBC, iron studies, IFOB.

## 2015-04-10 NOTE — Assessment & Plan Note (Signed)
Pt advised to keep her upcoming apt with endo.

## 2015-04-10 NOTE — Assessment & Plan Note (Signed)
Tolerating livalo.  Continue same, obtain lft/flp.

## 2015-04-10 NOTE — Progress Notes (Signed)
Subjective:    Patient ID: Dawn Huber, female    DOB: 21-Dec-1956, 59 y.o.   MRN: [id number]  HPI  Dawn Huber is a 59 yr old female who presents today for follow up.   1)Hyperlipidemia- Patient is currently maintained on the following medication for hyperlipidemia: livalo Last lipid panel as follows:   Lab Results  Component Value Date   CHOL 169 08/17/2014   HDL 58 08/17/2014   LDLCALC 82 08/17/2014   TRIG 143 08/17/2014   CHOLHDL 2.9 08/17/2014  Patient denies myalgia. Patient reports good compliance with low fat/low cholesterol diet.   2) Insomnia- maintained on [place]- reports that she sleeps well on [place].   3) Subclinical Hyperthyroidism-  Saw Dr. Cruzita Lederer back in 11/15.  She recommended repeat TFT's in May 2016.   4) Tobacco abuse- quit 1 year ago. Very occasional vape.   5) Anemia- She did not start iron supplement.    Review of Systems See HPI  Past Medical History  Diagnosis Date  . History of chicken pox   . Genital warts   . Hypertension   . Personal history of colonic polyps   . Diverticulosis   . Osteopenia 02/18/2012    History   Social History  . Marital Status: Married    Spouse Name: N/A  . Number of Children: 2  . Years of Education: N/A   Occupational History  . Not on file.   Social History Main Topics  . Smoking status: Former Smoker    Quit date: 08/16/2013  . Smokeless tobacco: Never Used     Comment: Pt states she "vapes".  . Alcohol Use: No  . Drug Use: Not on file  . Sexual Activity: Not on file   Other Topics Concern  . Not on file   Social History Narrative   Regular exercise:  No, walks dog daily   Caffeine Use:  12oz pepsi daily   Works as a Haematologist.   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   Married              Past Surgical History  Procedure Laterality Date  . Abdominal hysterectomy  1999    partial    Family History  Problem Relation Age of Onset  . Cancer  Mother     lung  . Emphysema Father   . Stroke Father   . Seizures Father   . Heart disease Father   . Hypertension Father   . Cancer Maternal Aunt     colon    Allergies  Allergen Reactions  . Amoxicillin Other (See Comments)    Itching of hands and feet    Current Outpatient Prescriptions on File Prior to Visit  Medication Sig Dispense Refill  . aspirin 81 MG tablet Take 324 mg by mouth daily.    . Cholecalciferol (VITAMIN D3) 2000 UNITS TABS Take 1 tablet by mouth daily.    . lansoprazole (PREVACID) 15 MG capsule TAKE 1 CAPSULE BY MOUTH DAILY. 30 capsule 5  . Pitavastatin Calcium (LIVALO) 4 MG TABS TAKE 1 TABLET BY MOUTH DAILY. 30 tablet 5  . zolpidem (AMBIEN) 10 MG tablet Take 1 tablet (10 mg total) by mouth at bedtime as needed. for sleep 30 tablet 0   No current facility-administered medications on file prior to visit.    BP 116/80 mmHg  Pulse 75  Temp(Src) 98.1 F (36.7 C) (Oral)  Resp 16  Ht 5\' 3"  (1.6 m)  Wt 217 lb 6.4 oz (98.612 kg)  BMI 38.52 kg/m2  SpO2 99%       Objective:   Physical Exam  Constitutional: She is oriented to person, place, and time. She appears well-developed and well-nourished.  Eyes: No scleral icterus.  Cardiovascular: Normal rate, regular rhythm and normal heart sounds.   No murmur heard. Pulmonary/Chest: Effort normal and breath sounds normal. No respiratory distress. She has no wheezes.  Musculoskeletal: She exhibits no edema.  Neurological: She is alert and oriented to person, place, and time.  Psychiatric: She has a normal mood and affect. Her behavior is normal. Judgment and thought content normal.          Assessment & Plan:  I commended pt on quitting smoking.

## 2015-04-10 NOTE — Patient Instructions (Addendum)
Please keep our upcoming appointment with Dr. Renne Crigler.  Complete lab work prior to leaving. Complete stool test and return at your earliest convenience. Follow up in 3 months.

## 2015-04-13 ENCOUNTER — Telehealth: Payer: Self-pay | Admitting: Family

## 2015-04-13 NOTE — Telephone Encounter (Signed)
Iron level is very low and she is very anemic.  Need her to complete IFOB ASAP please. Start iron 325mg  twice daily.  If + blood in stool will need to get her in to see GI.  Is she taking any NSAIDS?  If so needs to stop. Is she having black or bloody stools?

## 2015-04-14 NOTE — Telephone Encounter (Signed)
LMOM with contact name and number for return call RE: results and further provider instructions/SLS  

## 2015-04-14 NOTE — Telephone Encounter (Signed)
Pt returning your call,  VM was cut off [phone]

## 2015-04-17 ENCOUNTER — Other Ambulatory Visit: Payer: Managed Care, Other (non HMO)

## 2015-04-17 DIAGNOSIS — D649 Anemia, unspecified: Secondary | ICD-10-CM

## 2015-04-17 LAB — FECAL OCCULT BLOOD, IMMUNOCHEMICAL: FECAL OCCULT BLD: NEGATIVE

## 2015-04-17 NOTE — Telephone Encounter (Signed)
Patient returned your call  Please call her before noon @ [phone]

## 2015-04-17 NOTE — Telephone Encounter (Signed)
Called patient and notified her of lab results.  She stated understanding and stated she has already sent her IFOB.

## 2015-04-18 ENCOUNTER — Encounter: Payer: Self-pay | Admitting: Family

## 2015-04-18 NOTE — Telephone Encounter (Signed)
Hold livalo x 1 week and call or send mychart message with update on leg cramping.  Fatigue likely related to iron.

## 2015-04-18 NOTE — Telephone Encounter (Signed)
Pt came by the office, stated that you had called her back on yesterday after she had spoken with you regarding her labs, pt states she was at the airport boarding a flight and the phone dropped, pt would like for you to call her back at your earliest convenience.

## 2015-04-18 NOTE — Telephone Encounter (Signed)
Left detailed message on voicemail and to call if any questions. 

## 2015-04-18 NOTE — Telephone Encounter (Signed)
Notified patient of iron results and that stool was negative for blood- patient states she is fatigued with cramping in her legs and headaches.  She would like to know if this could be a side effect of the Livalo or if they are just symptoms of the low iron.  She states she would like to just follow-up in 3 months as previously recommended unless they could be side-effects of a medication.    Please advise.

## 2015-05-03 ENCOUNTER — Encounter: Payer: Self-pay | Admitting: Internal Medicine

## 2015-05-03 ENCOUNTER — Ambulatory Visit (INDEPENDENT_AMBULATORY_CARE_PROVIDER_SITE_OTHER): Payer: Managed Care, Other (non HMO) | Admitting: Internal Medicine

## 2015-05-03 VITALS — BP 104/68 | HR 75 | Temp 98.5°F | Resp 12 | Wt 220.0 lb

## 2015-05-03 DIAGNOSIS — D509 Iron deficiency anemia, unspecified: Secondary | ICD-10-CM

## 2015-05-03 DIAGNOSIS — E0590 Thyrotoxicosis, unspecified without thyrotoxic crisis or storm: Secondary | ICD-10-CM | POA: Diagnosis not present

## 2015-05-03 LAB — T4, FREE: FREE T4: 0.69 ng/dL (ref 0.60–1.60)

## 2015-05-03 LAB — TSH: TSH: 1.42 u[IU]/mL (ref 0.35–4.50)

## 2015-05-03 LAB — T3, FREE: T3, Free: 3.3 pg/mL (ref 2.3–4.2)

## 2015-05-03 NOTE — Progress Notes (Signed)
Patient ID: Dawn Huber, female   DOB: 03-08-1956, 59 y.o.   MRN: [id number]   HPI  Dawn Huber is a 59 y.o.-year-old female, returning for f/u for subclinical hyperthyroidism. Last visit 6 mo ago.  She was dx'ed with iron deficiency. She has had 2 colonoscopies (polyps). Recent hemoccult negative. Last Hb 8.4! She was started on 325 mg iron.   I reviewed pt's thyroid tests - she had 1 low TSH value. Thyroid tests checked 6 months ago were normal: Lab Results  Component Value Date   TSH 1.10 11/02/2014   TSH 0.20* 09/21/2014   TSH 3.772 01/20/2012   FREET4 0.81 11/02/2014   FREET4 0.82 09/26/2014    Pt c/o: - + excessive sweating/heat intolerance - no tremors - + anxiety - no palpitations - + fatigue, improving - not sleeping well, on Ambien  - no hyperdefecation, but some constipation - no weight loss - + mm weakness  Pt denies feeling nodules in neck, hoarseness, dysphagia/odynophagia, SOB with lying down.  Recent thyroid U/S (09/26/2014) - small cysts:   Right thyroid lobe: 4.5 x 1.6 x 1.4 cm. Heterogeneous thyroid echotexture. Small hypoechoic midpole tiny thyroid nodule only measures 5 mm.  Left thyroid lobe: 4.6 x 1.7 x 1.5 cm. Small hypoechoic thyroid cyst measures 8 mm. No other significant thyroid abnormality.  Isthmus Thickness: 4 mm. No nodules visualized.  Lymphadenopathy: None visualized. IMPRESSION: 5 mm right midpole thyroid nodule 8 mm left upper pole thyroid cyst  Pt also has a history of anemia, acid reflux, HL. Uses e-cigarettes occasionally.  ROS: Constitutional: see above Eyes:no blurry vision, no xerophthalmia ENT: no sore throat, no nodules palpated in throat, no dysphagia/odynophagia, no hoarseness Cardiovascular: no CP/SOB/palpitations/leg swelling Respiratory: no cough/SOB Gastrointestinal: no N/V/D/+ C Musculoskeletal: no muscle/+ joint aches Skin: no rashes Neurological: no tremors/numbness/tingling/dizziness, + HA  (better)  I reviewed pt's medications, allergies, PMH, social hx, family hx, and changes were documented in the history of present illness. Otherwise, unchanged from my initial visit note.  Past Medical History  Diagnosis Date  . History of chicken pox   . Genital warts   . Hypertension   . Personal history of colonic polyps   . Diverticulosis   . Osteopenia 02/18/2012   Past Surgical History  Procedure Laterality Date  . Abdominal hysterectomy  1999    partial   History   Social History  . Marital Status: Married    Spouse Name: N/A    Number of Children: 2   Social History Main Topics  . Smoking status: Former Smoker    Quit date: 2015  . Smokeless tobacco: Never Used     Comment: Pt states she "vapes".  . Alcohol Use: No  . Drug Use: No   Social History Narrative   Works as a Arboriculturist   She has 2 grown children and 4 grandchildren- family lives locally.   Married    Current Outpatient Prescriptions on File Prior to Visit  Medication Sig Dispense Refill  . aspirin 81 MG tablet Take 324 mg by mouth daily.    . calcium carbonate (OS-CAL) 600 MG TABS tablet Take 600 mg by mouth 2 (two) times daily with a meal.    . Cholecalciferol (VITAMIN D3) 2000 UNITS TABS Take 1 tablet by mouth daily.    . ferrous sulfate 325 (65 FE) MG tablet Take 325 mg by mouth 2 (two) times daily with a meal.    . Flaxseed, Linseed, (FLAX SEED OIL PO)  Take 1 capsule by mouth daily.    . lansoprazole (PREVACID) 15 MG capsule Take 1 capsule (15 mg total) by mouth daily. 30 capsule 5  . Pitavastatin Calcium (LIVALO) 4 MG TABS TAKE 1 TABLET BY MOUTH DAILY. 30 tablet 5  . zolpidem (AMBIEN) 10 MG tablet Take 1 tablet (10 mg total) by mouth at bedtime as needed. for sleep 30 tablet 0   No current facility-administered medications on file prior to visit.   Allergies  Allergen Reactions  . Amoxicillin Other (See Comments)    Itching of hands and feet   Family History  Problem Relation Age  of Onset  . Cancer Mother     lung  . Emphysema Father   . Stroke Father   . Seizures Father   . Heart disease Father   . Hypertension Father   . Cancer Maternal Aunt     colon   PE: BP 104/68 mmHg  Pulse 75  Temp(Src) 98.5 F (36.9 C) (Oral)  Resp 12  Wt 220 lb (99.791 kg)  SpO2 97% Body mass index is 38.98 kg/(m^2). Wt Readings from Last 3 Encounters:  05/03/15 220 lb (99.791 kg)  04/10/15 217 lb 6.4 oz (98.612 kg)  11/02/14 220 lb (99.791 kg)   Constitutional: overweight, in NAD Eyes: PERRLA, EOMI, no exophthalmos ENT: moist mucous membranes, no thyromegaly, no cervical lymphadenopathy Cardiovascular: RRR, No MRG Respiratory: CTA B Gastrointestinal: abdomen soft, NT, ND, BS+ Musculoskeletal: no deformities, strength intact in all 4 Skin: dry skin, warm, no rashes Neurological: no tremor with outstretched hands, DTR normal in all 4  ASSESSMENT: 1. Subclinical hyperthyroidism - Low TSH x1  2. Small thyroid cysts  3. IDA  PLAN:  1. Patient with one abnormal TSH (low), without thyrotoxic sxs: no weight loss, hyperdefecation, palpitations, anxiety. At last check, the TSH was normal.  - she does not appear to have exogenous causes for the low TSH. She has severe anemia (no cause found), but I don't think this will influence the TSH level. - we will check the TSH, fT3 and fT4 today - If the tests return abnormal, we may need an uptake and scan to differentiate between the 3 above possible etiologies  - RTC in 1 year, but possibly sooner for repeat labs  2. Thyroid cysts - I reassured the pt that subcm thyroid cysts are very common and very low risk for cancer - we may need a f/u U/S, but no sooner than ~5 year from previous  3. IDA - reviewed chart - ferritin 2.5; Hb 8.4; no cause found yet - takes iron daily - since we are drawing blood today, will add a celiac panel.  - no bloating, weight loss; has a rash on knuckles  Office Visit on 05/03/2015  Component  Date Value Ref Range Status  . TSH 05/03/2015 1.42  0.35 - 4.50 uIU/mL Final  . Free T4 05/03/2015 0.69  0.60 - 1.60 ng/dL Final  . T3, Free 05/03/2015 3.3  2.3 - 4.2 pg/mL Final  . Gliadin IgG 05/03/2015 2  <20 Units Final   Comment: Value Interpretation:  <20:   Antibody Not Detected >=20:   Antibody Detected   . Gliadin IgA 05/03/2015 6  <20 Units Final   Comment: Value Interpretation:  <20:   Antibody Not Detected >=20:   Antibody Detected   . Tissue Transglutaminase Ab, IgA 05/03/2015 1  <4 U/mL Final   Comment: Value Interpretation:   <4:   Antibody Not Detected  >=4:  Antibody Detected   . Reticulin Ab, IgA 05/03/2015 NEGATIVE  NEGATIVE Final  . Reticulin IgA titer 05/03/2015 SEE NOTE  <1:2.5 Final   Titer not indicated.   All labs normal.

## 2015-05-03 NOTE — Patient Instructions (Signed)
Please stop at the lab.  Please come back for a follow-up appointment in 1 year.  

## 2015-05-04 LAB — GLIADIN ANTIBODIES, SERUM
Gliadin IgA: 6 Units (ref <20)
Gliadin IgG: 2 Units (ref <20)

## 2015-05-04 LAB — TISSUE TRANSGLUTAMINASE, IGA: Tissue Transglutaminase Ab, IgA: 1 U/mL (ref <4)

## 2015-05-05 LAB — RETICULIN ANTIBODIES, IGA W TITER: RETICULIN AB, IGA: NEGATIVE

## 2015-05-15 ENCOUNTER — Telehealth: Payer: Self-pay | Admitting: Family

## 2015-05-15 MED ORDER — ZOLPIDEM TARTRATE 10 MG PO TABS: 10.0000 mg | ORAL_TABLET | Freq: Every evening | ORAL | Status: DC | PRN

## 2015-05-15 NOTE — Telephone Encounter (Signed)
Pt walked in to office for Rx. Rx printed, signed and given to front office staff.

## 2015-05-15 NOTE — Telephone Encounter (Signed)
Refill of ambien °

## 2015-06-19 ENCOUNTER — Other Ambulatory Visit: Payer: Self-pay | Admitting: Family

## 2015-06-19 MED ORDER — ZOLPIDEM TARTRATE 10 MG PO TABS: 10.0000 mg | ORAL_TABLET | Freq: Every evening | ORAL | Status: DC | PRN

## 2015-07-07 ENCOUNTER — Telehealth: Payer: Self-pay | Admitting: Family

## 2015-07-07 NOTE — Telephone Encounter (Signed)
Patient Name: Dawn Huber  DOB: 02-04-1956    Initial Comment Caller states pt has rectal bleeding, 'pouring out of her.'   Nurse Assessment  Nurse: Verlin Fester, RN, [name] Date/Time [name] Time): 07/07/2015 9:34:35 AM  Confirm and document reason for call. If symptomatic, describe symptoms. ---Patient states she is having cramping in the lower abdomen and she passed bright red blood with dark clots. She used a suppository for this. She has been tested for internal bleeding and they didn't find anything. She was anemic and has been taking medicine. States she is taking 4 baby aspirins a day. States she feels fine now and she is out grocery shopping and wants to wait to be seen until her appointment of Monday  Has the patient traveled out of the country within the last 30 days? ---No  Does the patient require triage? ---Yes  Related visit to physician within the last 2 weeks? ---No  Does the PT have any chronic conditions? (i.e. diabetes, asthma, etc.) ---Yes  List chronic conditions. ---"hemorrhoids"     Guidelines    Guideline Title Affirmed Question Affirmed Notes  Rectal Bleeding Toilet water turned red (Exceptions: turned a little pink, few drops of blood)    Final Disposition User   See Physician within 4 Hours (or PCP triage) Verlin Fester, RN, Venora Maples  Offered appointment and patient refuses and states she feels fine now and will keep her appointment on Monday.   Disagree/Comply: Disagree  Disagree/Comply Reason: Disagree with instructions

## 2015-07-10 ENCOUNTER — Telehealth: Payer: Self-pay | Admitting: Gastroenterology

## 2015-07-10 ENCOUNTER — Encounter: Payer: Self-pay | Admitting: Family

## 2015-07-10 ENCOUNTER — Other Ambulatory Visit: Payer: Self-pay | Admitting: Family

## 2015-07-10 ENCOUNTER — Ambulatory Visit (INDEPENDENT_AMBULATORY_CARE_PROVIDER_SITE_OTHER): Payer: Managed Care, Other (non HMO) | Admitting: Family

## 2015-07-10 VITALS — BP 120/86 | HR 72 | Temp 98.0°F | Resp 16 | Ht 63.0 in | Wt 218.0 lb

## 2015-07-10 DIAGNOSIS — K649 Unspecified hemorrhoids: Secondary | ICD-10-CM

## 2015-07-10 DIAGNOSIS — Z860100 Personal history of colon polyps, unspecified: Secondary | ICD-10-CM

## 2015-07-10 DIAGNOSIS — E785 Hyperlipidemia, unspecified: Secondary | ICD-10-CM | POA: Diagnosis not present

## 2015-07-10 DIAGNOSIS — D649 Anemia, unspecified: Secondary | ICD-10-CM

## 2015-07-10 LAB — CBC WITH DIFFERENTIAL/PLATELET
BASOS ABS: 0 10*3/uL (ref 0.0–0.1)
Basophils Relative: 0.5 % (ref 0.0–3.0)
EOS ABS: 0.1 10*3/uL (ref 0.0–0.7)
EOS PCT: 1.5 % (ref 0.0–5.0)
HEMATOCRIT: 41.1 % (ref 36.0–46.0)
Hemoglobin: 13.8 g/dL (ref 12.0–15.0)
LYMPHS ABS: 2.4 10*3/uL (ref 0.7–4.0)
Lymphocytes Relative: 36.4 % (ref 12.0–46.0)
MCHC: 33.6 g/dL (ref 30.0–36.0)
MCV: 86.9 fl (ref 78.0–100.0)
Monocytes Absolute: 0.5 10*3/uL (ref 0.1–1.0)
Monocytes Relative: 7.8 % (ref 3.0–12.0)
Neutro Abs: 3.5 10*3/uL (ref 1.4–7.7)
Neutrophils Relative %: 53.8 % (ref 43.0–77.0)
PLATELETS: 238 10*3/uL (ref 150.0–400.0)
RBC: 4.73 Mil/uL (ref 3.87–5.11)
RDW: 22.6 % — ABNORMAL HIGH (ref 11.5–15.5)
WBC: 6.6 10*3/uL (ref 4.0–10.5)

## 2015-07-10 LAB — LIPID PANEL
CHOL/HDL RATIO: 5
CHOLESTEROL: 254 mg/dL — AB (ref 0–200)
HDL: 49.9 mg/dL (ref >39.00)
NonHDL: 204.1
Triglycerides: 242 mg/dL — ABNORMAL HIGH (ref 0.0–149.0)
VLDL: 48.4 mg/dL — AB (ref 0.0–40.0)

## 2015-07-10 LAB — POCT HEMOGLOBIN: Hemoglobin: 14.5 g/dL (ref 12.2–16.2)

## 2015-07-10 LAB — HEPATIC FUNCTION PANEL
ALT: 20 U/L (ref 0–35)
AST: 18 U/L (ref 0–37)
Albumin: 4.2 g/dL (ref 3.5–5.2)
Alkaline Phosphatase: 73 U/L (ref 39–117)
BILIRUBIN TOTAL: 0.3 mg/dL (ref 0.2–1.2)
Bilirubin, Direct: 0.1 mg/dL (ref 0.0–0.3)
Total Protein: 7.1 g/dL (ref 6.0–8.3)

## 2015-07-10 LAB — IRON: IRON: 105 ug/dL (ref 42–145)

## 2015-07-10 LAB — LDL CHOLESTEROL, DIRECT: Direct LDL: 172 mg/dL

## 2015-07-10 MED ORDER — HYDROCORTISONE ACE-PRAMOXINE 1-1 % RE FOAM
1.0000 | Freq: Two times a day (BID) | RECTAL | Status: DC
Start: 2015-07-10 — End: 2016-10-23

## 2015-07-10 MED ORDER — PITAVASTATIN CALCIUM 4 MG PO TABS: 1.0000 | ORAL_TABLET | Freq: Every day | ORAL | Status: DC

## 2015-07-10 NOTE — Assessment & Plan Note (Signed)
Did not have any improvement in her cramping off of livalo. Resume.

## 2015-07-10 NOTE — Progress Notes (Signed)
Subjective:    Patient ID: Dawn Huber, female    DOB: 1956-04-25, 59 y.o.   MRN: [id number]  HPI  Dawn Huber is a 59 yr old female who presents today for follow up.  Hyperlipidemia-  She is maintained on fish oil and flax seed. She is not on a statin.  Lab Results  Component Value Date   CHOL 157 04/10/2015   HDL 54.80 04/10/2015   LDLCALC 79 04/10/2015   TRIG 116.0 04/10/2015   CHOLHDL 3 04/10/2015   Anemia-  Reports only taking iron once daily.  Lab Results  Component Value Date   WBC 5.4 04/10/2015   HGB 8.4 Repeated and verified X2.* 04/10/2015   HCT 26.7 aL* 04/10/2015   MCV 70.5* 04/10/2015   PLT 271.0 04/10/2015   Edema- reports that last week she had bilateral LE edema L>R, also having some cramping in her inner thighs.  Nausea- had lower abdominal cramping Friday AM. Reports that on Thursday afternoon she had + nausea. .  Reports LLQ abdominal pain on Friday and noticed blood on tissue after BM.  Reports that hemorrhoids flared on Friday AM.  Has been using prep H.  Reports that she can feel hemorrhoids on the outside.    Review of Systems    see HPI  Past Medical History  Diagnosis Date  . History of chicken pox   . Genital warts   . Hypertension   . Personal history of colonic polyps   . Diverticulosis   . Osteopenia 02/18/2012    History   Social History  . Marital Status: Married    Spouse Name: N/A  . Number of Children: 2  . Years of Education: N/A   Occupational History  . Not on file.   Social History Main Topics  . Smoking status: Former Smoker    Quit date: 08/16/2013  . Smokeless tobacco: Never Used     Comment: Pt states she "vapes".  . Alcohol Use: No  . Drug Use: Not on file  . Sexual Activity: Not on file   Other Topics Concern  . Not on file   Social History Narrative   Regular exercise:  No, walks dog daily   Caffeine Use:  12oz pepsi daily   Works as a Haematologist.   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   Married              Past Surgical History  Procedure Laterality Date  . Abdominal hysterectomy  1999    partial    Family History  Problem Relation Age of Onset  . Cancer Mother     lung  . Emphysema Father   . Stroke Father   . Seizures Father   . Heart disease Father   . Hypertension Father   . Cancer Maternal Aunt     colon    Allergies  Allergen Reactions  . Amoxicillin Other (See Comments)    Itching of hands and feet    Current Outpatient Prescriptions on File Prior to Visit  Medication Sig Dispense Refill  . aspirin 81 MG tablet Take 324 mg by mouth daily.    . calcium carbonate (OS-CAL) 600 MG TABS tablet Take 600 mg by mouth 2 (two) times daily with a meal. Pt taking one tablet a day    . Cholecalciferol (VITAMIN D3) 2000 UNITS TABS Take 1 tablet by mouth daily. Pt's tablets are 1000mg  each.    . ferrous  sulfate 325 (65 FE) MG tablet Take 325 mg by mouth 2 (two) times daily with a meal. Pt takes once a day.    . Flaxseed, Linseed, (FLAX SEED OIL PO) Take 1 capsule by mouth daily.    . lansoprazole (PREVACID) 15 MG capsule Take 1 capsule (15 mg total) by mouth daily. 30 capsule 5  . zolpidem (AMBIEN) 10 MG tablet Take 1 tablet (10 mg total) by mouth at bedtime as needed. for sleep 30 tablet 0   No current facility-administered medications on file prior to visit.    BP 120/86 mmHg  Pulse 72  Temp(Src) 98 F (36.7 C) (Oral)  Resp 16  Ht 5\' 3"  (1.6 m)  Wt 218 lb (98.884 kg)  BMI 38.63 kg/m2  SpO2 96%    Objective:   Physical Exam  Constitutional: She is oriented to person, place, and time. She appears well-developed and well-nourished.  HENT:  Head: Normocephalic and atraumatic.  Cardiovascular: Normal rate, regular rhythm and normal heart sounds.   No murmur heard. Pulmonary/Chest: Effort normal and breath sounds normal. No respiratory distress. She has no wheezes.  Abdominal: Soft. Bowel sounds are normal. She exhibits  no distension and no mass. There is no tenderness. There is no rebound and no guarding.  Musculoskeletal:  Trace bilateral LE edema  Neurological: She is alert and oriented to person, place, and time.  Psychiatric: She has a normal mood and affect. Her behavior is normal. Judgment and thought content normal.          Assessment & Plan:

## 2015-07-10 NOTE — Assessment & Plan Note (Signed)
Hemoglobin 14.5 today in office, send cbc with diff. Obtain iron level.

## 2015-07-10 NOTE — Patient Instructions (Signed)
Restart Livalo. Start proctofoam for hemorrhoids. You will be contacted about your referral to GI. Please let us know if you have not heard back within 1 week about your referral. Go to ER if you develop more than 1 bloody stool in a row. Follow up in 3 months.

## 2015-07-10 NOTE — Progress Notes (Signed)
Pre visit review using our clinic review tool, if applicable. No additional management support is needed unless otherwise documented below in the visit note. 

## 2015-07-10 NOTE — Telephone Encounter (Signed)
Spoke with pt, she is aware request for zolpidem is too soon. She is not needing at this time. Was just practicing refill request via mychart and will send another request when refill actually due.

## 2015-07-10 NOTE — Assessment & Plan Note (Signed)
Discussed protofoam HC, referral back to GI (due for follow up colo) high fiber diet.

## 2015-07-11 ENCOUNTER — Encounter: Payer: Self-pay | Admitting: Gastroenterology

## 2015-07-11 NOTE — Telephone Encounter (Signed)
Patient can schedule here for colon, but will need her records, I don't see anything here in her chart

## 2015-07-13 ENCOUNTER — Telehealth: Payer: Self-pay | Admitting: Family

## 2015-07-13 ENCOUNTER — Other Ambulatory Visit: Payer: Self-pay | Admitting: Family

## 2015-07-13 ENCOUNTER — Encounter: Payer: Self-pay | Admitting: Family

## 2015-07-13 MED ORDER — FERROUS SULFATE 325 (65 FE) MG PO TABS: 325.0000 mg | ORAL_TABLET | Freq: Every day | ORAL | Status: DC

## 2015-07-14 MED ORDER — ZOLPIDEM TARTRATE 10 MG PO TABS: 10.0000 mg | ORAL_TABLET | Freq: Every evening | ORAL | Status: DC | PRN

## 2015-07-14 MED ORDER — PITAVASTATIN CALCIUM 4 MG PO TABS: 1.0000 | ORAL_TABLET | Freq: Every day | ORAL | Status: DC

## 2015-07-14 NOTE — Addendum Note (Signed)
Addended by: Kelle Darting A on: 07/14/2015 07:29 AM   Modules accepted: Orders

## 2015-07-14 NOTE — Telephone Encounter (Signed)
Rx faxed to pharmacy  

## 2015-07-14 NOTE — Telephone Encounter (Signed)
Last zolpidem Rx 05/2715.  CSC signed 03/2015 and no UDS on file?  Rx forwarded to Provider for signature. Will obtain UDS at next f/u.

## 2015-08-08 ENCOUNTER — Telehealth: Payer: Self-pay | Admitting: Gastroenterology

## 2015-08-08 NOTE — Telephone Encounter (Signed)
8/16 - see phone note dated 07/10/2015.  I have left message for patient to contact our office since we have not received previous GI records.

## 2015-08-09 NOTE — Telephone Encounter (Signed)
Appointment  For Colonoscopy scheduled for 09-13-15. We have past Colon report filed upstairs in the PreVisit Rm 51 Folder.

## 2015-08-18 ENCOUNTER — Other Ambulatory Visit: Payer: Self-pay | Admitting: Family

## 2015-08-18 MED ORDER — ZOLPIDEM TARTRATE 10 MG PO TABS: 10.0000 mg | ORAL_TABLET | Freq: Every evening | ORAL | Status: DC | PRN

## 2015-08-18 NOTE — Telephone Encounter (Signed)
Medication Detail      Disp Refills Start End     zolpidem (AMBIEN) 10 MG tablet 30 tablet 0 07/14/2015     Sig - Route: Take 1 tablet (10 mg total) by mouth at bedtime as needed. for sleep - Oral    Class: [place], [place] - [place]: 07.18.16 ROV: 3-Mths Rx printed for provider to sign & fax/SLS

## 2015-08-23 ENCOUNTER — Telehealth: Payer: Self-pay | Admitting: Medical

## 2015-08-23 NOTE — Telephone Encounter (Signed)
Per 08/18/15 documentation, Rx was printed and faxed to pharmacy.  Left message on [facility] pharmacy voicemail to call if they did not receive Rx.

## 2015-08-23 NOTE — Telephone Encounter (Signed)
I got request refill of ambien from pt. It looks like pt of Melissa. Would you pass on request for refill to her. On review I don't think I have seen her.

## 2015-08-29 ENCOUNTER — Ambulatory Visit (AMBULATORY_SURGERY_CENTER): Payer: Self-pay

## 2015-08-29 VITALS — Ht 62.75 in | Wt 220.2 lb

## 2015-08-29 DIAGNOSIS — Z860100 Personal history of colon polyps, unspecified: Secondary | ICD-10-CM

## 2015-08-29 MED ORDER — SUPREP BOWEL PREP KIT 17.5-3.13-1.6 GM/177ML PO SOLN: 1.0000 | Freq: Once | ORAL | Status: DC

## 2015-08-29 NOTE — Progress Notes (Signed)
No allergies to eggs or soy No home oxygen No diet/weight loss meds No pastproblems with anesthesia except low b/p and cons sed did not work  Has email  Emmi instructions given for colonoscopy

## 2015-08-31 ENCOUNTER — Encounter: Payer: Self-pay | Admitting: Gastroenterology

## 2015-09-13 ENCOUNTER — Encounter: Payer: Self-pay | Admitting: Gastroenterology

## 2015-09-13 ENCOUNTER — Ambulatory Visit (AMBULATORY_SURGERY_CENTER): Payer: Managed Care, Other (non HMO) | Admitting: Gastroenterology

## 2015-09-13 VITALS — BP 121/91 | HR 65 | Temp 97.5°F | Resp 16 | Ht 63.0 in | Wt 218.0 lb

## 2015-09-13 DIAGNOSIS — D509 Iron deficiency anemia, unspecified: Secondary | ICD-10-CM | POA: Diagnosis not present

## 2015-09-13 DIAGNOSIS — Z86010 Personal history of colonic polyps: Secondary | ICD-10-CM

## 2015-09-13 DIAGNOSIS — D123 Benign neoplasm of transverse colon: Secondary | ICD-10-CM

## 2015-09-13 DIAGNOSIS — D124 Benign neoplasm of descending colon: Secondary | ICD-10-CM | POA: Diagnosis not present

## 2015-09-13 MED ORDER — SODIUM CHLORIDE 0.9 % IV SOLN: 500.0000 mL | INTRAVENOUS | Status: DC

## 2015-09-13 NOTE — Progress Notes (Signed)
Called to room to assist during endoscopic procedure.  Patient ID and intended procedure confirmed with present staff. Received instructions for my participation in the procedure from the performing physician.  

## 2015-09-13 NOTE — Op Note (Signed)
[facility]  [address] [place], [id number]   COLONOSCOPY PROCEDURE REPORT  PATIENT: Dawn Huber, Dawn Huber  MR#: [id number] BIRTHDATE: Sep 17, 1956 , 58  yrs. old GENDER: female ENDOSCOPIST: Ladene Artist, MD, [facility] REFERRED DX:[id number] Inda Castle, FNP PROCEDURE DATE:  09/13/2015 PROCEDURE:   Colonoscopy, diagnostic and Colonoscopy with snare polypectomy First Screening Colonoscopy - Avg.  risk and is 50 yrs.  old or older - No.  Prior Negative Screening - Now for repeat screening. N/A  History of Adenoma - Now for follow-up colonoscopy & has been > or = to 3 yrs.  Yes hx of adenoma.  Has been 3 or more years since last colonoscopy.  Polyps removed today? Yes ASA CLASS:   Class II INDICATIONS:Unexplained iron deficiency anemia, Surveillance due to prior colonic neoplasia, and PH Colon Adenoma. MEDICATIONS: Monitored anesthesia care and Propofol 300 mg IV DESCRIPTION OF PROCEDURE:   After the risks benefits and alternatives of the procedure were thoroughly explained, informed consent was obtained.  The digital rectal exam revealed no abnormalities of the rectum.   The LB [id number]  endoscope was introduced through the anus and advanced to the cecum, which was identified by both the appendix and ileocecal valve. No adverse events experienced.   The quality of the prep was good.  (Suprep was used)  The instrument was then slowly withdrawn as the colon was fully examined. Estimated blood loss is zero unless otherwise noted in this procedure report.   COLON FINDINGS: Two sessile polyps measuring 6 mm in size were found in the descending colon and transverse colon.  Polypectomies were performed with a cold snare.  The resection was complete, the polyp tissue was completely retrieved and sent to histology.   There was mild diverticulosis noted in the sigmoid colon and descending colon.   The examination was otherwise normal.  Retroflexed views revealed internal  Grade I hemorrhoids. The time to cecum = 3.0 Withdrawal time = 12.4   The scope was withdrawn and the procedure completed. COMPLICATIONS: There were no immediate complications.  ENDOSCOPIC IMPRESSION: 1.   Two sessile polyps in the descending colon and transverse colon; polypectomies performed with a cold snare 2.   Mild diverticulosis in the sigmoid colon and descending colon 3.   Grade l internal hemorrhoids  RECOMMENDATIONS: 1.  Await pathology results 2.  High fiber diet with liberal fluid intake. 3.  Repeat Colonoscopy in 5 years. 4.  Upper endoscopy to further evaluate iron deficiency  eSigned:  Ladene Artist, MD, [facility], [facility], [facility] 09/13/2015 9:17 AM

## 2015-09-13 NOTE — Progress Notes (Signed)
Report to PACU, RN, vss, BBS= Clear.  

## 2015-09-13 NOTE — Patient Instructions (Signed)
Impressions/recommedations:  Polyps (handout given) Diverticulosis (handout given) High Fiber diet (handout given)  Hemorrhoids (handout given)  Appointments made for previsit and EGD.  YOU HAD AN ENDOSCOPIC PROCEDURE TODAY AT [facility]:   Refer to the procedure report that was given to you for any specific questions about what was found during the examination.  If the procedure report does not answer your questions, please call your gastroenterologist to clarify.  If you requested that your care partner not be given the details of your procedure findings, then the procedure report has been included in a sealed envelope for you to review at your convenience later.  YOU SHOULD EXPECT: Some feelings of bloating in the abdomen. Passage of more gas than usual.  Walking can help get rid of the air that was put into your GI tract during the procedure and reduce the bloating. If you had a lower endoscopy (such as a colonoscopy or flexible sigmoidoscopy) you may notice spotting of blood in your stool or on the toilet paper. If you underwent a bowel prep for your procedure, you may not have a normal bowel movement for a few days.  Please Note:  You might notice some irritation and congestion in your nose or some drainage.  This is from the oxygen used during your procedure.  There is no need for concern and it should clear up in a day or so.  SYMPTOMS TO REPORT IMMEDIATELY:   Following lower endoscopy (colonoscopy or flexible sigmoidoscopy):  Excessive amounts of blood in the stool  Significant tenderness or worsening of abdominal pains  Swelling of the abdomen that is new, acute  Fever of 100F or higher  For urgent or emergent issues, a gastroenterologist can be reached at any hour by calling [phone].   DIET: Your first meal following the procedure should be a small meal and then it is ok to progress to your normal diet. Heavy or fried foods are harder to digest and may  make you feel nauseous or bloated.  Likewise, meals heavy in dairy and vegetables can increase bloating.  Drink plenty of fluids but you should avoid alcoholic beverages for 24 hours.  ACTIVITY:  You should plan to take it easy for the rest of today and you should NOT DRIVE or use heavy machinery until tomorrow (because of the sedation medicines used during the test).    FOLLOW UP: Our staff will call the number listed on your records the next business day following your procedure to check on you and address any questions or concerns that you may have regarding the information given to you following your procedure. If we do not reach you, we will leave a message.  However, if you are feeling well and you are not experiencing any problems, there is no need to return our call.  We will assume that you have returned to your regular daily activities without incident.  If any biopsies were taken you will be contacted by phone or by letter within the next 1-3 weeks.  Please call us at [phone] if you have not heard about the biopsies in 3 weeks.    SIGNATURES/CONFIDENTIALITY: You and/or your care partner have signed paperwork which will be entered into your electronic medical record.  These signatures attest to the fact that that the information above on your After Visit Summary has been reviewed and is understood.  Full responsibility of the confidentiality of this discharge information lies with you and/or your care-partner.

## 2015-09-14 ENCOUNTER — Telehealth: Payer: Self-pay | Admitting: *Deleted

## 2015-09-14 NOTE — Telephone Encounter (Signed)
  Follow up Call-  Call back number 09/13/2015  Post procedure Call Back phone  # [phone]  Permission to leave phone message Yes     Patient questions:  Do you have a fever, pain , or abdominal swelling? No. Pain Score  0 *  Have you tolerated food without any problems? Yes.    Have you been able to return to your normal activities? Yes.    Do you have any questions about your discharge instructions: Diet   No. Medications  No. Follow up visit  No.  Do you have questions or concerns about your Care? No.  Actions: * If pain score is 4 or above: No action needed, pain <4.

## 2015-09-18 ENCOUNTER — Telehealth: Payer: Self-pay | Admitting: Physician Assistant

## 2015-09-18 ENCOUNTER — Encounter: Payer: Self-pay | Admitting: Gastroenterology

## 2015-09-18 MED ORDER — ZOLPIDEM TARTRATE 10 MG PO TABS
10.0000 mg | ORAL_TABLET | Freq: Every evening | ORAL | Status: DC | PRN
Start: 2015-09-18 — End: 2015-10-18

## 2015-09-18 NOTE — Telephone Encounter (Signed)
Rx faxed to pharmacy at 10:26am.

## 2015-09-18 NOTE — Telephone Encounter (Signed)
Last zolpidem Rx 08/18/15.  Rx printed, #30 x no refills and forwarded to PCP for signature.

## 2015-09-25 ENCOUNTER — Encounter: Payer: Self-pay | Admitting: Gastroenterology

## 2015-10-09 ENCOUNTER — Encounter: Payer: Self-pay | Admitting: Family

## 2015-10-09 ENCOUNTER — Telehealth: Payer: Self-pay | Admitting: Family

## 2015-10-09 ENCOUNTER — Ambulatory Visit (INDEPENDENT_AMBULATORY_CARE_PROVIDER_SITE_OTHER): Payer: Managed Care, Other (non HMO) | Admitting: Family

## 2015-10-09 VITALS — BP 127/77 | HR 73 | Temp 98.6°F | Resp 16 | Ht 63.0 in | Wt 220.6 lb

## 2015-10-09 DIAGNOSIS — G4700 Insomnia, unspecified: Secondary | ICD-10-CM | POA: Diagnosis not present

## 2015-10-09 DIAGNOSIS — E785 Hyperlipidemia, unspecified: Secondary | ICD-10-CM | POA: Diagnosis not present

## 2015-10-09 DIAGNOSIS — D649 Anemia, unspecified: Secondary | ICD-10-CM

## 2015-10-09 DIAGNOSIS — K219 Gastro-esophageal reflux disease without esophagitis: Secondary | ICD-10-CM | POA: Diagnosis not present

## 2015-10-09 LAB — CBC WITH DIFFERENTIAL/PLATELET
BASOS ABS: 0 10*3/uL (ref 0.0–0.1)
Basophils Relative: 0.6 % (ref 0.0–3.0)
EOS ABS: 0.1 10*3/uL (ref 0.0–0.7)
Eosinophils Relative: 2.2 % (ref 0.0–5.0)
HCT: 39.6 % (ref 36.0–46.0)
Hemoglobin: 13.2 g/dL (ref 12.0–15.0)
LYMPHS ABS: 2.1 10*3/uL (ref 0.7–4.0)
LYMPHS PCT: 32.4 % (ref 12.0–46.0)
MCHC: 33.4 g/dL (ref 30.0–36.0)
MCV: 97.8 fl (ref 78.0–100.0)
MONOS PCT: 8.1 % (ref 3.0–12.0)
Monocytes Absolute: 0.5 10*3/uL (ref 0.1–1.0)
NEUTROS PCT: 56.7 % (ref 43.0–77.0)
Neutro Abs: 3.7 10*3/uL (ref 1.4–7.7)
PLATELETS: 219 10*3/uL (ref 150.0–400.0)
RBC: 4.04 Mil/uL (ref 3.87–5.11)
RDW: 14.3 % (ref 11.5–15.5)
WBC: 6.5 10*3/uL (ref 4.0–10.5)

## 2015-10-09 LAB — LIPID PANEL
CHOL/HDL RATIO: 3
Cholesterol: 153 mg/dL (ref 0–200)
HDL: 51.1 mg/dL (ref >39.00)
LDL Cholesterol: 67 mg/dL (ref 0–99)
NONHDL: 101.92
TRIGLYCERIDES: 177 mg/dL — AB (ref 0.0–149.0)
VLDL: 35.4 mg/dL (ref 0.0–40.0)

## 2015-10-09 LAB — IRON: Iron: 256 ug/dL — ABNORMAL HIGH (ref 42–145)

## 2015-10-09 MED ORDER — PITAVASTATIN CALCIUM 4 MG PO TABS: 1.0000 | ORAL_TABLET | Freq: Every day | ORAL | Status: DC

## 2015-10-09 NOTE — Assessment & Plan Note (Signed)
Stable on prevacid. Continue same.  

## 2015-10-09 NOTE — Telephone Encounter (Signed)
Called patient with lab results.  

## 2015-10-09 NOTE — Patient Instructions (Signed)
Please complete lab work prior to leaving. Please schedule a follow up appointment in 6 months.

## 2015-10-09 NOTE — Progress Notes (Signed)
Subjective:    Patient ID: Dawn Huber, female    DOB: 12-24-1955, 59 y.o.   MRN: [id number]  HPI  Dawn Huber is a 59 yr old female who presents today for follow up.  1) Hyperlipidemia- maintained on livalo 4 mg.  This was restarted last visit.  Lab Results  Component Value Date   CHOL 254* 07/10/2015   HDL 49.90 07/10/2015   LDLCALC 79 04/10/2015   LDLDIRECT 172.0 07/10/2015   TRIG 242.0* 07/10/2015   CHOLHDL 5 07/10/2015   2) Anemia-  Pt is maintained on iron. She is scheduled for endoscopy in November.  Denies blood in stool.  Lab Results  Component Value Date   WBC 6.6 07/10/2015   HGB 14.5 07/10/2015   HCT 41.1 07/10/2015   MCV 86.9 07/10/2015   PLT 238.0 07/10/2015   3) GERD-  Currently on prevacid.  Reports symptoms are well controlled.   4) Insomnia- maintained on ambien. Takes nightly.  Reports that she sleeps well on ambien.   Review of Systems See HPI  Past Medical History  Diagnosis Date  . History of chicken pox   . Genital warts   . Hypertension   . Personal history of colonic polyps   . Diverticulosis   . Osteopenia 02/18/2012    Social History   Social History  . Marital Status: Married    Spouse Name: N/A  . Number of Children: 2  . Years of Education: N/A   Occupational History  . Not on file.   Social History Main Topics  . Smoking status: Former Smoker    Quit date: 08/16/2013  . Smokeless tobacco: Never Used     Comment: Pt states she "vapes".  . Alcohol Use: No  . Drug Use: No  . Sexual Activity: Not on file   Other Topics Concern  . Not on file   Social History Narrative   Regular exercise:  No, walks dog daily   Caffeine Use:  12oz pepsi daily   Works as a Haematologist.   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   Married              Past Surgical History  Procedure Laterality Date  . Abdominal hysterectomy  1999    partial  . Tubal ligation      Family History  Problem  Relation Age of Onset  . Cancer Mother     lung  . Emphysema Father   . Stroke Father   . Seizures Father   . Heart disease Father   . Hypertension Father   . Cancer Maternal Aunt     colon    Allergies  Allergen Reactions  . Amoxicillin Other (See Comments)    Itching of hands and feet    Current Outpatient Prescriptions on File Prior to Visit  Medication Sig Dispense Refill  . aspirin 81 MG tablet Take 324 mg by mouth daily.    . calcium carbonate (OS-CAL) 600 MG TABS tablet Take 600 mg by mouth 2 (two) times daily with a meal. Pt taking one tablet a day    . Cholecalciferol (VITAMIN D3) 2000 UNITS TABS Take 1 tablet by mouth daily. Pt's tablets are 1000mg  each.    . ferrous sulfate 325 (65 FE) MG tablet Take 1 tablet (325 mg total) by mouth daily with breakfast. Pt takes once a day.    . Flaxseed, Linseed, (FLAX SEED OIL PO) Take 1 capsule by mouth  daily.    . hydrocortisone-pramoxine (PROCTOFOAM HC) rectal foam Place 1 applicator rectally 2 (two) times daily. 10 g 2  . lansoprazole (PREVACID) 15 MG capsule Take 1 capsule (15 mg total) by mouth daily. 30 capsule 5  . Omega-3 Fatty Acids (FISH OIL) 1200 MG CAPS Take 2 capsules by mouth daily.    . Pitavastatin Calcium (LIVALO) 4 MG TABS Take 1 tablet (4 mg total) by mouth daily. 30 tablet 3  . zolpidem (AMBIEN) 10 MG tablet Take 1 tablet (10 mg total) by mouth at bedtime as needed. for sleep 30 tablet 0   No current facility-administered medications on file prior to visit.    BP 127/77 mmHg  Pulse 73  Temp(Src) 98.6 F (37 C) (Oral)  Resp 16  Ht 5\' 3"  (1.6 m)  Wt 220 lb 9.6 oz (100.064 kg)  BMI 39.09 kg/m2  SpO2 98%       Objective:   Physical Exam  Constitutional: She is oriented to person, place, and time. She appears well-developed and well-nourished.  HENT:  Head: Normocephalic and atraumatic.  Cardiovascular: Normal rate, regular rhythm and normal heart sounds.   No murmur heard. Pulmonary/Chest: Effort  normal and breath sounds normal. No respiratory distress. She has no wheezes.  Musculoskeletal: She exhibits no edema.  Neurological: She is alert and oriented to person, place, and time.  Psychiatric: She has a normal mood and affect. Her behavior is normal. Judgment and thought content normal.          Assessment & Plan:  Pt wishes to get flu shot at [facility].

## 2015-10-09 NOTE — Telephone Encounter (Signed)
Iron level is high. D/c iron supplement.    Cholesterol looks better on livalo.  triglycerides are mildly elevated. Please work on avoiding concentrated sweets, and limiting white carbs (rice/bread/pasta/potatoes). Instead substitute whole grain versions with reasonable portions.

## 2015-10-09 NOTE — Assessment & Plan Note (Addendum)
Stable on iron, continue same. Obtain cbc, serum iron. Has upcoming endoscopy scheduled.

## 2015-10-09 NOTE — Assessment & Plan Note (Signed)
Stable on ambien.  Continue ame.

## 2015-10-09 NOTE — Progress Notes (Signed)
Pre visit review using our clinic review tool, if applicable. No additional management support is needed unless otherwise documented below in the visit note. 

## 2015-10-13 ENCOUNTER — Encounter: Payer: Self-pay | Admitting: Family

## 2015-10-17 ENCOUNTER — Other Ambulatory Visit: Payer: Self-pay | Admitting: Family

## 2015-10-18 ENCOUNTER — Encounter: Payer: Self-pay | Admitting: Family

## 2015-10-18 MED ORDER — ZOLPIDEM TARTRATE 10 MG PO TABS: 10.0000 mg | ORAL_TABLET | Freq: Every evening | ORAL | Status: DC | PRN

## 2015-10-18 NOTE — Telephone Encounter (Signed)
Rx printed, signed and given to pt.

## 2015-10-23 ENCOUNTER — Telehealth: Payer: Self-pay

## 2015-10-23 NOTE — Telephone Encounter (Signed)
Erroneous encounter.  Last colon report states need for egd r/t iron def

## 2015-10-23 NOTE — Telephone Encounter (Signed)
I don't see an indication for a recall EGD. There was no Barrett's on biopsies from 2005 EGD. She can schedule an office visit if she wants [place] to look for another reason that EGD might be indicated.

## 2015-10-23 NOTE — Telephone Encounter (Signed)
Pt for recall EGD.  Last one 2005 [facility].  In Epic.  OK to proceed as direct or would you like an OV?  Thank you, Triton Heidrich/PV

## 2015-10-23 NOTE — Telephone Encounter (Signed)
Yes, I thought you were referring the question of Barretts on the 2005 EGD. I now have reviewed the recent colonoscopy report and the patient needs an EGD for Fe deificiency

## 2015-10-23 NOTE — Telephone Encounter (Signed)
Looking at your last report I found where you wanted EGD for iron def (instead of it being a recall).  Thank you, will proceed as you recommended on report.  Angela/PV

## 2015-10-25 ENCOUNTER — Ambulatory Visit (AMBULATORY_SURGERY_CENTER): Payer: Self-pay | Admitting: *Deleted

## 2015-10-25 VITALS — Ht 63.0 in | Wt 223.0 lb

## 2015-10-25 DIAGNOSIS — D509 Iron deficiency anemia, unspecified: Secondary | ICD-10-CM

## 2015-10-25 NOTE — Progress Notes (Signed)
Patient denies any allergies to eggs or soy. Patient denies any problems with anesthesia/sedation. Patient denies any oxygen use at home and does not take any diet/weight loss medications. EMMI education assisgned to patient on EGD, this was explained and instructions given to patient. 

## 2015-11-06 ENCOUNTER — Ambulatory Visit (AMBULATORY_SURGERY_CENTER): Payer: Managed Care, Other (non HMO) | Admitting: Gastroenterology

## 2015-11-06 ENCOUNTER — Encounter: Payer: Self-pay | Admitting: Gastroenterology

## 2015-11-06 VITALS — BP 130/76 | HR 59 | Temp 97.3°F | Resp 15 | Ht 63.0 in | Wt 223.0 lb

## 2015-11-06 DIAGNOSIS — K2970 Gastritis, unspecified, without bleeding: Secondary | ICD-10-CM | POA: Diagnosis not present

## 2015-11-06 DIAGNOSIS — D509 Iron deficiency anemia, unspecified: Secondary | ICD-10-CM | POA: Diagnosis present

## 2015-11-06 DIAGNOSIS — K3189 Other diseases of stomach and duodenum: Secondary | ICD-10-CM | POA: Diagnosis not present

## 2015-11-06 MED ORDER — SODIUM CHLORIDE 0.9 % IV SOLN: 500.0000 mL | INTRAVENOUS | Status: DC

## 2015-11-06 NOTE — Progress Notes (Signed)
Report to PACU, RN, vss, BBS= Clear.  

## 2015-11-06 NOTE — Patient Instructions (Signed)
YOU HAD AN ENDOSCOPIC PROCEDURE TODAY AT [facility]:   Refer to the procedure report that was given to you for any specific questions about what was found during the examination.  If the procedure report does not answer your questions, please call your gastroenterologist to clarify.  If you requested that your care partner not be given the details of your procedure findings, then the procedure report has been included in a sealed envelope for you to review at your convenience later.  YOU SHOULD EXPECT: Some feelings of bloating in the abdomen. Passage of more gas than usual.  Walking can help get rid of the air that was put into your GI tract during the procedure and reduce the bloating.   Please Note:  You might notice some irritation and congestion in your nose or some drainage.  This is from the oxygen used during your procedure.  There is no need for concern and it should clear up in a day or so.  SYMPTOMS TO REPORT IMMEDIATELY:    Following upper endoscopy (EGD)  Vomiting of blood or coffee ground material  New chest pain or pain under the shoulder blades  Painful or persistently difficult swallowing  New shortness of breath  Fever of 100F or higher  Black, tarry-looking stools  For urgent or emergent issues, a gastroenterologist can be reached at any hour by calling [phone].   DIET: Your first meal following the procedure should be a small meal and then it is ok to progress to your normal diet. Heavy or fried foods are harder to digest and may make you feel nauseous or bloated. No alcohol for 24 hours.  ACTIVITY:  You should plan to take it easy for the rest of today and you should NOT DRIVE or use heavy machinery until tomorrow (because of the sedation medicines used during the test).    FOLLOW UP: Our staff will call the number listed on your records the next business day following your procedure to check on you and address any questions or concerns that  you may have regarding the information given to you following your procedure. If we do not reach you, we will leave a message.  However, if you are feeling well and you are not experiencing any problems, there is no need to return our call.  We will assume that you have returned to your regular daily activities without incident.  If any biopsies were taken you will be contacted by phone or by letter within the next 1-3 weeks.  Please call us at [phone] if you have not heard about the biopsies in 3 weeks.    SIGNATURES/CONFIDENTIALITY: You and/or your care partner have signed paperwork which will be entered into your electronic medical record.  These signatures attest to the fact that that the information above on your After Visit Summary has been reviewed and is understood.  Full responsibility of the confidentiality of this discharge information lies with you and/or your care-partner.  Be sure to take your medicine for GERD.

## 2015-11-06 NOTE — Progress Notes (Signed)
Called to room to assist during endoscopic procedure.  Patient ID and intended procedure confirmed with present staff. Received instructions for my participation in the procedure from the performing physician.  

## 2015-11-06 NOTE — Op Note (Signed)
[place]  [address]. [place], [id number]   ENDOSCOPY PROCEDURE REPORT PATIENT: Dawn Huber, Dawn Huber  MR#: [id number] BIRTHDATE: 1956-07-21 , 59  yrs. old GENDER: female ENDOSCOPIST: Ladene Artist, MD, [facility] PROCEDURE DATE:  11/06/2015 PROCEDURE:  EGD w/ biopsy ASA CLASS:     Class II INDICATIONS:  iron deficiency anemia. MEDICATIONS: Monitored anesthesia care and Propofol 200 mg IV TOPICAL ANESTHETIC: none DESCRIPTION OF PROCEDURE: After the risks benefits and alternatives of the procedure were thoroughly explained, informed consent was obtained.  The LB [id number] endoscope was introduced through the mouth and advanced to the second portion of the duodenum , Without limitations.  The instrument was slowly withdrawn as the mucosa was fully examined.  STOMACH: Two cameron's erosion(s) were found in the gastric fundus. Gastritis was found in the gastric antrum.  Linear erythema. Mild gastritis, ? mild GAVE. Multiple biopsies were performed.   The stomach otherwise appeared normal. ESOPHAGUS: The mucosa of the esophagus appeared normal. DUODENUM: The duodenal mucosa showed no abnormalities in the bulb and 2nd part of the duodenum.  Cold forceps biopsies were taken in the bulb and second portion.  Retroflexed views revealed a 5 cm hiatal hernia.  The scope was then withdrawn from the patient and the procedure completed. COMPLICATIONS: There were no immediate complications. ENDOSCOPIC IMPRESSION: 1.   Cameron erosions in the gastric fundus 2.   Gastritis in the gastric antrum; multiple biopsies performed 3.   5 cm hiatal hernia  RECOMMENDATIONS: 1.  Anti-reflux regimen 2.  Await pathology results 3.  Continue PPI  eSigned:  Ladene Artist, MD, [facility] 11/06/2015 9:54 AM

## 2015-11-07 ENCOUNTER — Telehealth: Payer: Self-pay | Admitting: Emergency Medicine

## 2015-11-07 NOTE — Telephone Encounter (Signed)
Left message to f/u, no identifier  

## 2015-11-14 ENCOUNTER — Encounter: Payer: Self-pay | Admitting: Gastroenterology

## 2015-11-17 ENCOUNTER — Other Ambulatory Visit: Payer: Self-pay | Admitting: Family

## 2015-11-20 ENCOUNTER — Encounter: Payer: Self-pay | Admitting: *Deleted

## 2015-11-20 MED ORDER — ZOLPIDEM TARTRATE 10 MG PO TABS: 10.0000 mg | ORAL_TABLET | Freq: Every evening | ORAL | Status: DC | PRN

## 2015-11-20 NOTE — Telephone Encounter (Signed)
Pt walked in to pick up Zolpidem Rx. Last Rx printed 10/18/15. Pt is due for UDS and will collect now.  Rx printed, signed by PCP and given to pt.

## 2015-12-11 ENCOUNTER — Telehealth: Payer: Self-pay | Admitting: Family

## 2015-12-11 NOTE — Telephone Encounter (Signed)
Pt went to mychart for UDS results and was told to go to website. She cannot get in. Pt requesting call with results.

## 2015-12-11 NOTE — Telephone Encounter (Signed)
Notified pt and she voices understanding. 

## 2015-12-19 ENCOUNTER — Telehealth: Payer: Self-pay | Admitting: Family

## 2015-12-19 NOTE — Telephone Encounter (Signed)
Refill x1 only 

## 2015-12-19 NOTE — Telephone Encounter (Signed)
Pt is requesting refill on Ambien. [facility] Pt.  Last OV: 10/09/2015 Last Fill: 11/20/2015 #30 and 0RF  Please advise.

## 2015-12-20 MED ORDER — ZOLPIDEM TARTRATE 10 MG PO TABS: 10.0000 mg | ORAL_TABLET | Freq: Every evening | ORAL | Status: DC | PRN

## 2015-12-20 NOTE — Telephone Encounter (Signed)
Rx printed, awaiting MD signature.  

## 2015-12-21 NOTE — Telephone Encounter (Signed)
Rx faxed to [facility]. 

## 2016-01-22 ENCOUNTER — Other Ambulatory Visit: Payer: Self-pay | Admitting: Family Medicine

## 2016-01-22 MED FILL — LIVALO 4 MG TABLET: 4 | 30 days supply | Qty: 30 | Fill #2

## 2016-01-22 MED FILL — LANSOPRAZOLE DR 15 MG CAP: 15 | 30 days supply | Qty: 30 | Fill #3

## 2016-01-22 NOTE — Telephone Encounter (Signed)
Last seen 10/09/15 and filled 12/20/15 #30  Please advise    KP

## 2016-01-23 MED ORDER — ZOLPIDEM TARTRATE 10 MG PO TABS: 10.0000 mg | ORAL_TABLET | Freq: Every evening | ORAL | Status: DC | PRN

## 2016-01-23 MED FILL — ZOLPIDEM TARTRATE 10 MG TAB: 10 | 30 days supply | Qty: 30 | Fill #0

## 2016-01-23 NOTE — Telephone Encounter (Signed)
Rx faxed to pharmacy  

## 2016-02-22 ENCOUNTER — Telehealth: Payer: Self-pay | Admitting: Family

## 2016-02-22 MED FILL — LIVALO 4 MG TABLET: 4 | 30 days supply | Qty: 30 | Fill #3

## 2016-02-22 MED FILL — LANSOPRAZOLE DR 15 MG CAP: 15 | 30 days supply | Qty: 30 | Fill #4

## 2016-02-22 NOTE — Telephone Encounter (Signed)
Pt is requesting refill on Ambien.  Last OV: 10/09/2015, appt scheduled 04/09/2015 at 0700 Last Fill: 01/23/2016 #30 and 0RF    Please advise.

## 2016-02-23 MED ORDER — ZOLPIDEM TARTRATE 10 MG PO TABS: 10.0000 mg | ORAL_TABLET | Freq: Every evening | ORAL | Status: DC | PRN

## 2016-02-23 MED FILL — ZOLPIDEM TARTRATE 10 MG TAB: 10 | 30 days supply | Qty: 30 | Fill #0

## 2016-02-23 NOTE — Telephone Encounter (Signed)
Rx faxed to pharmacy  

## 2016-02-23 NOTE — Telephone Encounter (Signed)
See rx. 

## 2016-03-25 ENCOUNTER — Telehealth: Payer: Self-pay | Admitting: Family

## 2016-03-25 ENCOUNTER — Other Ambulatory Visit: Payer: Self-pay | Admitting: Family

## 2016-03-25 MED ORDER — ZOLPIDEM TARTRATE 10 MG PO TABS: 10.0000 mg | ORAL_TABLET | Freq: Every evening | ORAL | Status: DC | PRN

## 2016-03-25 MED FILL — LANSOPRAZOLE DR 15 MG CAP: 15 | 30 days supply | Qty: 30 | Fill #5

## 2016-03-25 MED FILL — ZOLPIDEM TARTRATE 10 MG TAB: 10 | 30 days supply | Qty: 30 | Fill #0

## 2016-03-25 MED FILL — LIVALO 4 MG TABLET: 4 | 30 days supply | Qty: 30 | Fill #4

## 2016-03-25 NOTE — Telephone Encounter (Signed)
Last zoplidem Rx 02/23/16.  Next f/u 04/08/16. Last UDS 11/20/15. Rx printed and forwarded to PCP for signature.

## 2016-03-25 NOTE — Telephone Encounter (Signed)
Rx faxed to pharmacy, message sent.

## 2016-04-08 ENCOUNTER — Encounter: Payer: Self-pay | Admitting: Family

## 2016-04-08 ENCOUNTER — Ambulatory Visit (INDEPENDENT_AMBULATORY_CARE_PROVIDER_SITE_OTHER): Payer: Managed Care, Other (non HMO) | Admitting: Family

## 2016-04-08 VITALS — BP 110/70 | HR 76 | Temp 98.1°F | Resp 16 | Ht 63.0 in | Wt 223.8 lb

## 2016-04-08 DIAGNOSIS — G4700 Insomnia, unspecified: Secondary | ICD-10-CM | POA: Diagnosis not present

## 2016-04-08 DIAGNOSIS — D649 Anemia, unspecified: Secondary | ICD-10-CM | POA: Diagnosis not present

## 2016-04-08 DIAGNOSIS — E785 Hyperlipidemia, unspecified: Secondary | ICD-10-CM

## 2016-04-08 DIAGNOSIS — K219 Gastro-esophageal reflux disease without esophagitis: Secondary | ICD-10-CM | POA: Diagnosis not present

## 2016-04-08 LAB — CBC WITH DIFFERENTIAL/PLATELET
Basophils Absolute: 0 10*3/uL (ref 0.0–0.1)
Basophils Relative: 0.8 % (ref 0.0–3.0)
EOS PCT: 2.3 % (ref 0.0–5.0)
Eosinophils Absolute: 0.1 10*3/uL (ref 0.0–0.7)
HCT: 38 % (ref 36.0–46.0)
Hemoglobin: 12.8 g/dL (ref 12.0–15.0)
LYMPHS ABS: 2.3 10*3/uL (ref 0.7–4.0)
Lymphocytes Relative: 39.1 % (ref 12.0–46.0)
MCHC: 33.5 g/dL (ref 30.0–36.0)
MCV: 90.6 fl (ref 78.0–100.0)
MONOS PCT: 9 % (ref 3.0–12.0)
Monocytes Absolute: 0.5 10*3/uL (ref 0.1–1.0)
NEUTROS ABS: 2.8 10*3/uL (ref 1.4–7.7)
NEUTROS PCT: 48.8 % (ref 43.0–77.0)
PLATELETS: 233 10*3/uL (ref 150.0–400.0)
RBC: 4.2 Mil/uL (ref 3.87–5.11)
RDW: 13.7 % (ref 11.5–15.5)
WBC: 5.8 10*3/uL (ref 4.0–10.5)

## 2016-04-08 LAB — LIPID PANEL
CHOL/HDL RATIO: 3
Cholesterol: 167 mg/dL (ref 0–200)
HDL: 58.3 mg/dL (ref >39.00)
LDL Cholesterol: 82 mg/dL (ref 0–99)
NONHDL: 108.36
Triglycerides: 130 mg/dL (ref 0.0–149.0)
VLDL: 26 mg/dL (ref 0.0–40.0)

## 2016-04-08 LAB — HEPATIC FUNCTION PANEL
ALK PHOS: 57 U/L (ref 39–117)
ALT: 18 U/L (ref 0–35)
AST: 19 U/L (ref 0–37)
Albumin: 4.3 g/dL (ref 3.5–5.2)
BILIRUBIN DIRECT: 0 mg/dL (ref 0.0–0.3)
Total Bilirubin: 0.4 mg/dL (ref 0.2–1.2)
Total Protein: 7.1 g/dL (ref 6.0–8.3)

## 2016-04-08 LAB — FERRITIN: Ferritin: 6.9 ng/mL — ABNORMAL LOW (ref 10.0–291.0)

## 2016-04-08 MED ORDER — PITAVASTATIN CALCIUM 4 MG PO TABS: 1.0000 | ORAL_TABLET | Freq: Every day | ORAL | Status: DC

## 2016-04-08 MED ORDER — LANSOPRAZOLE 15 MG PO CPDR: 15.0000 mg | DELAYED_RELEASE_CAPSULE | Freq: Every day | ORAL | Status: DC

## 2016-04-08 NOTE — Progress Notes (Signed)
Pre visit review using our clinic review tool, if applicable. No additional management support is needed unless otherwise documented below in the visit note.,mh

## 2016-04-08 NOTE — Assessment & Plan Note (Signed)
Stable on ambien, continue same.  

## 2016-04-08 NOTE — Assessment & Plan Note (Signed)
Obtain follow up iron studies/ cbc.

## 2016-04-08 NOTE — Assessment & Plan Note (Signed)
Clinically stable on PPI.  Continue same.

## 2016-04-08 NOTE — Patient Instructions (Signed)
Please complete lab work prior to leaving.   

## 2016-04-08 NOTE — Progress Notes (Signed)
Subjective:    Patient ID: Dawn Huber, female    DOB: 1956-01-06, 60 y.o.   MRN: [id number]  HPI  Dawn Huber is a 60 yr old female who presents today for follow up.  1) Hyperlipidemia- on flaxseed, omega 3 and livalo.   Lab Results  Component Value Date   CHOL 153 10/09/2015   HDL 51.10 10/09/2015   LDLCALC 67 10/09/2015   LDLDIRECT 172.0 07/10/2015   TRIG 177.0* 10/09/2015   CHOLHDL 3 10/09/2015   2) Anemia-  She is no longer taking iron secondary to elevated iron levels.  Lab Results  Component Value Date   WBC 6.5 10/09/2015   HGB 13.2 10/09/2015   HCT 39.6 10/09/2015   MCV 97.8 10/09/2015   PLT 219.0 10/09/2015   3) GERD- underwent EGD in November which noted gastritis.    4) Insomnia- maintained on ambien.  Reports that she is sleeping well with ambien. Has tried not taking ambien but can't sleep without it.   Review of Systems  Gastrointestinal:       Denies black/bloody stools  Musculoskeletal: Negative for myalgias.      see HPI  Past Medical History  Diagnosis Date  . History of chicken pox   . Genital warts   . Hypertension   . Personal history of colonic polyps   . Diverticulosis   . Osteopenia 02/18/2012     Social History   Social History  . Marital Status: Married    Spouse Name: N/A  . Number of Children: 2  . Years of Education: N/A   Occupational History  . Not on file.   Social History Main Topics  . Smoking status: Former Smoker    Quit date: 08/16/2013  . Smokeless tobacco: Never Used     Comment: Pt states she "vapes".  . Alcohol Use: No  . Drug Use: No  . Sexual Activity: Not on file   Other Topics Concern  . Not on file   Social History Narrative   Regular exercise:  No, walks dog daily   Caffeine Use:  12oz pepsi daily   Works as a Haematologist.   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   Married              Past Surgical History  Procedure Laterality Date  . Abdominal  hysterectomy  1999    partial  . Tubal ligation      Family History  Problem Relation Age of Onset  . Cancer Mother     lung  . Emphysema Father   . Stroke Father   . Seizures Father   . Heart disease Father   . Hypertension Father   . Colon cancer Maternal Aunt 65    Allergies  Allergen Reactions  . Amoxicillin Other (See Comments)    Itching of hands and feet    Current Outpatient Prescriptions on File Prior to Visit  Medication Sig Dispense Refill  . aspirin 81 MG tablet Take 324 mg by mouth daily.    . calcium carbonate (OS-CAL) 600 MG TABS tablet Take 600 mg by mouth 2 (two) times daily with a meal. Pt taking one tablet a day    . Cholecalciferol (VITAMIN D3) 2000 UNITS TABS Take 1 tablet by mouth daily. Pt's tablets are 1000mg  each.    . Flaxseed, Linseed, (FLAX SEED OIL PO) Take 1 capsule by mouth daily.    . hydrocortisone-pramoxine (PROCTOFOAM HC) rectal foam Place  1 applicator rectally 2 (two) times daily. 10 g 2  . lansoprazole (PREVACID) 15 MG capsule TAKE 1 CAPSULE (15 MG TOTAL) BY MOUTH DAILY. 30 capsule 5  . Omega-3 Fatty Acids (FISH OIL) 1200 MG CAPS Take 2 capsules by mouth daily.    . Pitavastatin Calcium (LIVALO) 4 MG TABS Take 1 tablet (4 mg total) by mouth daily. 30 tablet 5  . Probiotic Product (PROBIOTIC DAILY) CAPS Take by mouth.    . zolpidem (AMBIEN) 10 MG tablet Take 1 tablet (10 mg total) by mouth at bedtime as needed. for sleep 30 tablet 0   No current facility-administered medications on file prior to visit.    BP 110/70 mmHg  Pulse 76  Temp(Src) 98.1 F (36.7 C) (Oral)  Resp 16  Ht 5\' 3"  (1.6 m)  Wt 223 lb 12.8 oz (101.515 kg)  BMI 39.65 kg/m2  SpO2 98%    Objective:   Physical Exam  Constitutional: She is oriented to person, place, and time. She appears well-developed and well-nourished.  HENT:  Head: Normocephalic and atraumatic.  Cardiovascular: Normal rate, regular rhythm and normal heart sounds.   No murmur  heard. Pulmonary/Chest: Effort normal and breath sounds normal. No respiratory distress. She has no wheezes.  Musculoskeletal: She exhibits no edema.  Neurological: She is alert and oriented to person, place, and time.  Psychiatric: She has a normal mood and affect. Her behavior is normal. Judgment and thought content normal.          Assessment & Plan:

## 2016-04-08 NOTE — Assessment & Plan Note (Signed)
Obtain FLP, continue livalo.

## 2016-04-09 ENCOUNTER — Encounter: Payer: Self-pay | Admitting: Family

## 2016-04-15 ENCOUNTER — Encounter: Payer: Self-pay | Admitting: Family

## 2016-04-24 ENCOUNTER — Other Ambulatory Visit: Payer: Self-pay | Admitting: Family

## 2016-04-24 MED ORDER — ZOLPIDEM TARTRATE 10 MG PO TABS: 10.0000 mg | ORAL_TABLET | Freq: Every evening | ORAL | Status: DC | PRN

## 2016-04-24 MED FILL — LANSOPRAZOLE DR 15 MG CAP: 15 | 30 days supply | Qty: 30 | Fill #0

## 2016-04-24 MED FILL — LIVALO 4 MG TABLET: 4 | 30 days supply | Qty: 30 | Fill #0

## 2016-04-24 MED FILL — ZOLPIDEM TARTRATE 10 MG TAB: 10 | 30 days supply | Qty: 30 | Fill #0

## 2016-04-24 NOTE — Telephone Encounter (Signed)
Last zolpidem Rx 03/25/16, #30. UDS:  Due 05/19/16. Will have pt pick up Rx and provide UDS at that time Next OV:  09/2016  Rx printed and forwarded to PCP for signature. Rx placed at front desk for pick up and message sent to pt.

## 2016-05-02 ENCOUNTER — Encounter: Payer: Self-pay | Admitting: Internal Medicine

## 2016-05-02 ENCOUNTER — Ambulatory Visit (INDEPENDENT_AMBULATORY_CARE_PROVIDER_SITE_OTHER): Payer: Managed Care, Other (non HMO) | Admitting: Internal Medicine

## 2016-05-02 VITALS — BP 120/62 | HR 71 | Temp 98.3°F | Resp 12 | Wt 222.0 lb

## 2016-05-02 DIAGNOSIS — E0590 Thyrotoxicosis, unspecified without thyrotoxic crisis or storm: Secondary | ICD-10-CM | POA: Diagnosis not present

## 2016-05-02 LAB — T3, FREE: T3, Free: 3.7 pg/mL (ref 2.3–4.2)

## 2016-05-02 LAB — TSH: TSH: 1.35 u[IU]/mL (ref 0.35–4.50)

## 2016-05-02 LAB — T4, FREE: FREE T4: 0.71 ng/dL (ref 0.60–1.60)

## 2016-05-02 NOTE — Patient Instructions (Signed)
Please stop at the lab.  Please ask PCP to draw a TSH level at the time of your annual labs.  Please schedule another appt with me as needed.

## 2016-05-02 NOTE — Progress Notes (Signed)
Patient ID: Dawn Huber, female   DOB: July 20, 1956, 60 y.o.   MRN: [id number]   Dawn  SOLENNE Huber is a 60 y.o.-year-old female, returning for f/u for subclinical hyperthyroidism. Last visit 1 year ago.  She has anemia >> started iron >> needed to back off of it as iron level increased.  I reviewed pt's thyroid tests - she had 1 low TSH value. Thyroid tests checked 1 year ago were normal: Lab Results  Component Value Date   TSH 1.42 05/03/2015   TSH 1.10 11/02/2014   TSH 0.20* 09/21/2014   TSH 3.772 01/20/2012   FREET4 0.69 05/03/2015   FREET4 0.81 11/02/2014   FREET4 0.82 09/26/2014    Pt c/o: - + fatigue - fluctuating - + weight gain (2 lbs in last per our scale) - no excessive sweating/heat intolerance - no tremors - no palpitations - not sleeping well, on Ambien  - no hyperdefecation, no constipation - no weight loss - no weakness  Pt denies feeling nodules in neck, hoarseness, dysphagia/odynophagia, SOB with lying down.  Thyroid U/S (09/26/2014) - small cysts:   Right thyroid lobe: 4.5 x 1.6 x 1.4 cm. Heterogeneous thyroid echotexture. Small hypoechoic midpole tiny thyroid nodule only measures 5 mm.  Left thyroid lobe: 4.6 x 1.7 x 1.5 cm. Small hypoechoic thyroid cyst measures 8 mm. No other significant thyroid abnormality.  Isthmus Thickness: 4 mm. No nodules visualized.  Lymphadenopathy: None visualized. IMPRESSION: 5 mm right midpole thyroid nodule 8 mm left upper pole thyroid cyst  Pt also has a history of anemia, acid reflux, HL. Uses e-cigarettes occasionally.  She has granuloma annulare.  ROS: Constitutional: see above Eyes:no blurry vision, no xerophthalmia ENT: no sore throat, no nodules palpated in throat, no dysphagia/odynophagia, no hoarseness Cardiovascular: no CP/SOB/palpitations/leg swelling Respiratory: no cough/SOB Gastrointestinal: no N/V/D/C Musculoskeletal: no muscle/joint aches  Skin: + rash - on hands Neurological: no  tremors/numbness/tingling/dizziness  I reviewed pt's medications, allergies, PMH, social hx, family hx, and changes were documented in the history of present illness. Otherwise, unchanged from my initial visit note.  Past Medical History  Diagnosis Date  . History of chicken pox   . Genital warts   . Hypertension   . Personal history of colonic polyps   . Diverticulosis   . Osteopenia 02/18/2012   Past Surgical History  Procedure Laterality Date  . Abdominal hysterectomy  1999    partial  . Tubal ligation     History   Social History  . Marital Status: Married    Spouse Name: N/A    Number of Children: 2   Social History Main Topics  . Smoking status: Former Smoker    Quit date: 2015  . Smokeless tobacco: Never Used     Comment: Pt states she "vapes".  . Alcohol Use: No  . Drug Use: No   Social History Narrative   Works as a Arboriculturist   She has 2 grown children and 4 grandchildren- family lives locally.   Married    Current Outpatient Prescriptions on File Prior to Visit  Medication Sig Dispense Refill  . aspirin 81 MG tablet Take 324 mg by mouth daily.    . calcium carbonate (OS-CAL) 600 MG TABS tablet Take 600 mg by mouth 2 (two) times daily with a meal. Pt taking one tablet a day    . Cholecalciferol (VITAMIN D3) 2000 UNITS TABS Take 1 tablet by mouth daily. Pt's tablets are 1000mg  each.    . Flaxseed,  Linseed, (FLAX SEED OIL PO) Take 1 capsule by mouth daily.    . hydrocortisone-pramoxine (PROCTOFOAM HC) rectal foam Place 1 applicator rectally 2 (two) times daily. 10 g 2  . lansoprazole (PREVACID) 15 MG capsule Take 1 capsule (15 mg total) by mouth daily at 12 noon. 30 capsule 5  . Omega-3 Fatty Acids (FISH OIL) 1200 MG CAPS Take 2 capsules by mouth daily.    . Pitavastatin Calcium (LIVALO) 4 MG TABS Take 1 tablet (4 mg total) by mouth daily. 30 tablet 5  . Probiotic Product (PROBIOTIC DAILY) CAPS Take by mouth.    . vitamin B-12 (CYANOCOBALAMIN) 1000 MCG  tablet Take 1,000 mcg by mouth daily. chewable    . zolpidem (AMBIEN) 10 MG tablet Take 1 tablet (10 mg total) by mouth at bedtime as needed. for sleep 30 tablet 0   No current facility-administered medications on file prior to visit.   Allergies  Allergen Reactions  . Amoxicillin Other (See Comments)    Itching of hands and feet   Family History  Problem Relation Age of Onset  . Cancer Mother     lung  . Emphysema Father   . Stroke Father   . Seizures Father   . Heart disease Father   . Hypertension Father   . Colon cancer Maternal Aunt 65   PE: BP 120/62 mmHg  Pulse 71  Temp(Src) 98.3 F (36.8 C) (Oral)  Resp 12  Wt 222 lb (100.699 kg)  SpO2 94% Body mass index is 39.34 kg/(m^2). Wt Readings from Last 3 Encounters:  05/02/16 222 lb (100.699 kg)  04/08/16 223 lb 12.8 oz (101.515 kg)  11/06/15 223 lb (101.152 kg)   Constitutional: overweight, in NAD Eyes: PERRLA, EOMI, no exophthalmos ENT: moist mucous membranes, no thyromegaly, no cervical lymphadenopathy Cardiovascular: RRR, No MRG Respiratory: CTA B Gastrointestinal: abdomen soft, NT, ND, BS+ Musculoskeletal: no deformities, strength intact in all 4 Skin: dry skin, warm, no rashes Neurological: no tremor with outstretched hands, DTR normal in all 4  ASSESSMENT: 1. Subclinical hyperthyroidism - Low TSH x1  2. Small thyroid cysts  PLAN:  1. Patient with one abnormal TSH (low), without thyrotoxic sxs: no weight loss, hyperdefecation, palpitations, anxiety. At last check, 1 year ago, the TSH was normal.  - she does not appear to have exogenous causes for the low TSH. No Biotin, no steroids, no amiodarone, no iodine supplements, etc. - we will check the TSH, fT3 and fT4 today - If the tests return abnormal, we may need an uptake and scan to differentiate between the 3 above possible etiologies  - Will advise her to follow up with yearly TSH levels by PCP and return if they become abnormal  2. Thyroid cysts - we  discussed that subcm thyroid cysts are very common and not worrisome - I advised her to let me know if she starts having neck compression sxs, in which case we will need a new U/S  Office Visit on 05/02/2016  Component Date Value Ref Range Status  . T3, Free 05/02/2016 3.7  2.3 - 4.2 pg/mL Final  . Free T4 05/02/2016 0.71  0.60 - 1.60 ng/dL Final  . TSH 05/02/2016 1.35  0.35 - 4.50 uIU/mL Final  TFTs normal.

## 2016-05-27 ENCOUNTER — Telehealth: Payer: Self-pay | Admitting: Family

## 2016-05-27 MED ORDER — ZOLPIDEM TARTRATE 10 MG PO TABS: 10.0000 mg | ORAL_TABLET | Freq: Every evening | ORAL | Status: DC | PRN

## 2016-05-27 MED FILL — ZOLPIDEM TARTRATE 10 MG TAB: 10 | 30 days supply | Qty: 30 | Fill #0

## 2016-05-27 MED FILL — LIVALO 4 MG TABLET: 4 | 30 days supply | Qty: 30 | Fill #1

## 2016-05-27 MED FILL — LANSOPRAZOLE DR 15 MG CAP: 15 | 30 days supply | Qty: 30 | Fill #1

## 2016-05-27 NOTE — Telephone Encounter (Signed)
Rx was faxed to the pharmacy at 12:15pm.

## 2016-05-27 NOTE — Telephone Encounter (Signed)
Last zolpidem Rx: 04/24/16, #30 UDS: moderate, last 04/2016 and due to repeat 07/2016 Next OV:  09/2016  Rx printed and forwarded to PCP for signature.

## 2016-05-27 NOTE — Addendum Note (Signed)
Addended by: Kelle Darting A on: 05/27/2016 11:57 AM   Modules accepted: Orders

## 2016-06-20 ENCOUNTER — Encounter: Payer: Self-pay | Admitting: Family

## 2016-06-26 ENCOUNTER — Telehealth: Payer: Self-pay | Admitting: Family

## 2016-06-26 MED ORDER — ZOLPIDEM TARTRATE 10 MG PO TABS: 10.0000 mg | ORAL_TABLET | Freq: Every evening | ORAL | Status: DC | PRN

## 2016-06-26 MED FILL — LIVALO 4 MG TABLET: 4 | 30 days supply | Qty: 30 | Fill #2

## 2016-06-26 MED FILL — LANSOPRAZOLE DR 15 MG CAP: 15 | 30 days supply | Qty: 30 | Fill #2

## 2016-06-26 MED FILL — ZOLPIDEM TARTRATE 10 MG TAB: 10 | 30 days supply | Qty: 30 | Fill #0

## 2016-06-26 NOTE — Telephone Encounter (Signed)
Last Rx: 05/27/16, #30 UDS: moderate 04/24/16 Next OV: 10/07/16  Rx printed and forwarded to PCP for signature.

## 2016-06-26 NOTE — Addendum Note (Signed)
Addended by: Kelle Darting A on: 06/26/2016 09:33 AM   Modules accepted: Orders

## 2016-06-26 NOTE — Telephone Encounter (Signed)
Rx faxed to pharmacy, message sent to pt. 

## 2016-07-29 ENCOUNTER — Other Ambulatory Visit: Payer: Self-pay | Admitting: Family

## 2016-07-29 MED ORDER — ZOLPIDEM TARTRATE 10 MG PO TABS: 10.0000 mg | ORAL_TABLET | Freq: Every evening | ORAL | 0 refills | Status: DC | PRN

## 2016-07-29 MED FILL — LANSOPRAZOLE DR 15 MG CAP: 15 | 30 days supply | Qty: 30 | Fill #3

## 2016-07-29 MED FILL — LIVALO 4 MG TABLET: 4 | 30 days supply | Qty: 30 | Fill #3

## 2016-07-29 MED FILL — ZOLPIDEM TARTRATE 10 MG TAB: 10 | 30 days supply | Qty: 30 | Fill #0

## 2016-07-29 NOTE — Telephone Encounter (Signed)
Rx was faxed to pharmacy at 10:53am.

## 2016-07-29 NOTE — Telephone Encounter (Signed)
Last zolpidem rx: 06/26/16, #30 Next OV: 09/2016 UDS: 04/24/16  Rx printed and forwarded to PCP for signature.

## 2016-08-29 ENCOUNTER — Other Ambulatory Visit: Payer: Self-pay | Admitting: Family

## 2016-08-29 MED FILL — LANSOPRAZOLE DR 15 MG CAP: 15 | 30 days supply | Qty: 30 | Fill #4

## 2016-08-29 MED FILL — LIVALO 4 MG TABLET: 4 | 30 days supply | Qty: 30 | Fill #4

## 2016-08-30 MED ORDER — ZOLPIDEM TARTRATE 10 MG PO TABS: 10.0000 mg | ORAL_TABLET | Freq: Every evening | ORAL | 0 refills | Status: DC | PRN

## 2016-08-30 MED FILL — ZOLPIDEM TARTRATE 10 MG TAB: 10 | 30 days supply | Qty: 30 | Fill #0

## 2016-08-30 NOTE — Telephone Encounter (Signed)
See rx. 

## 2016-08-30 NOTE — Telephone Encounter (Signed)
Rx faxed to pharmacy  

## 2016-09-30 ENCOUNTER — Other Ambulatory Visit: Payer: Self-pay | Admitting: Family

## 2016-09-30 MED ORDER — ZOLPIDEM TARTRATE 10 MG PO TABS: 10.0000 mg | ORAL_TABLET | Freq: Every evening | ORAL | 0 refills | Status: DC | PRN

## 2016-09-30 MED FILL — ZOLPIDEM TARTRATE 10 MG TAB: 10 | 30 days supply | Qty: 30 | Fill #0

## 2016-09-30 MED FILL — LIVALO 4 MG TABLET: 4 | 30 days supply | Qty: 30 | Fill #5

## 2016-09-30 MED FILL — LANSOPRAZOLE DR 15 MG CAP: 15 | 30 days supply | Qty: 30 | Fill #5

## 2016-09-30 NOTE — Telephone Encounter (Signed)
Last Rf:  08/30/16, #30 Next OV;  10/08/16.  Will need UDS at that visit.  Rx printed and forwarded to PCP for signature.

## 2016-10-07 ENCOUNTER — Ambulatory Visit (INDEPENDENT_AMBULATORY_CARE_PROVIDER_SITE_OTHER): Payer: Managed Care, Other (non HMO) | Admitting: Family

## 2016-10-07 ENCOUNTER — Encounter: Payer: Self-pay | Admitting: Family

## 2016-10-07 VITALS — BP 107/72 | HR 78 | Temp 98.8°F | Resp 16 | Ht 63.0 in | Wt 202.0 lb

## 2016-10-07 DIAGNOSIS — D509 Iron deficiency anemia, unspecified: Secondary | ICD-10-CM

## 2016-10-07 DIAGNOSIS — Z0000 Encounter for general adult medical examination without abnormal findings: Secondary | ICD-10-CM

## 2016-10-07 LAB — URINALYSIS, ROUTINE W REFLEX MICROSCOPIC
BILIRUBIN URINE: NEGATIVE
KETONES UR: NEGATIVE
Nitrite: NEGATIVE
SPECIFIC GRAVITY, URINE: 1.025 (ref 1.000–1.030)
Total Protein, Urine: NEGATIVE
Urine Glucose: NEGATIVE
Urobilinogen, UA: 0.2 (ref 0.0–1.0)
pH: 5.5 (ref 5.0–8.0)

## 2016-10-07 LAB — CBC WITH DIFFERENTIAL/PLATELET
BASOS PCT: 0.7 % (ref 0.0–3.0)
Basophils Absolute: 0.1 10*3/uL (ref 0.0–0.1)
EOS ABS: 0.1 10*3/uL (ref 0.0–0.7)
EOS PCT: 1.7 % (ref 0.0–5.0)
HEMATOCRIT: 39.2 % (ref 36.0–46.0)
HEMOGLOBIN: 13 g/dL (ref 12.0–15.0)
LYMPHS PCT: 30.3 % (ref 12.0–46.0)
Lymphs Abs: 2.3 10*3/uL (ref 0.7–4.0)
MCHC: 33.2 g/dL (ref 30.0–36.0)
MCV: 90.2 fl (ref 78.0–100.0)
MONOS PCT: 7.8 % (ref 3.0–12.0)
Monocytes Absolute: 0.6 10*3/uL (ref 0.1–1.0)
NEUTROS ABS: 4.5 10*3/uL (ref 1.4–7.7)
Neutrophils Relative %: 59.5 % (ref 43.0–77.0)
PLATELETS: 219 10*3/uL (ref 150.0–400.0)
RBC: 4.35 Mil/uL (ref 3.87–5.11)
RDW: 15 % (ref 11.5–15.5)
WBC: 7.5 10*3/uL (ref 4.0–10.5)

## 2016-10-07 LAB — HEPATIC FUNCTION PANEL
ALBUMIN: 4.6 g/dL (ref 3.5–5.2)
ALT: 16 U/L (ref 0–35)
AST: 17 U/L (ref 0–37)
Alkaline Phosphatase: 59 U/L (ref 39–117)
BILIRUBIN DIRECT: 0.1 mg/dL (ref 0.0–0.3)
TOTAL PROTEIN: 7.5 g/dL (ref 6.0–8.3)
Total Bilirubin: 0.5 mg/dL (ref 0.2–1.2)

## 2016-10-07 LAB — BASIC METABOLIC PANEL
BUN: 9 mg/dL (ref 6–23)
CHLORIDE: 103 meq/L (ref 96–112)
CO2: 30 meq/L (ref 19–32)
CREATININE: 0.79 mg/dL (ref 0.40–1.20)
Calcium: 10 mg/dL (ref 8.4–10.5)
GFR: 79.19 mL/min (ref >60.00)
Glucose, Bld: 97 mg/dL (ref 70–99)
POTASSIUM: 4 meq/L (ref 3.5–5.1)
Sodium: 142 mEq/L (ref 135–145)

## 2016-10-07 LAB — LIPID PANEL
Cholesterol: 154 mg/dL (ref 0–200)
HDL: 58.9 mg/dL (ref >39.00)
LDL CALC: 71 mg/dL (ref 0–99)
NONHDL: 95.15
Total CHOL/HDL Ratio: 3
Triglycerides: 120 mg/dL (ref 0.0–149.0)
VLDL: 24 mg/dL (ref 0.0–40.0)

## 2016-10-07 LAB — IRON: IRON: 78 ug/dL (ref 42–145)

## 2016-10-07 LAB — TSH: TSH: 1.39 u[IU]/mL (ref 0.35–4.50)

## 2016-10-07 NOTE — Patient Instructions (Addendum)
Please complete lab work prior to leaving. ?Keep up the good work with diet/exercise and weight loss. ? ?

## 2016-10-07 NOTE — Progress Notes (Signed)
Pre visit review using our clinic review tool, if applicable. No additional management support is needed unless otherwise documented below in the visit note. 

## 2016-10-07 NOTE — Progress Notes (Signed)
Subjective:    Patient ID: Dawn Huber, female    DOB: Aug 25, 1956, 60 y.o.   MRN: [id number]  HPI  Patient presents today for complete physical.  Immunizations: tdap up to date, flu shot up to date Diet: eating healthy Exercise: more active, no formal exercise, plans to return to [employer] Colonoscopy: 09/13/15 Dexa: 2013 (declines) Pap Smear: hysterectomy Mammogram: declines, last mammo 2015  Wt Readings from Last 3 Encounters:  10/07/16 202 lb (91.6 kg)  05/02/16 222 lb (100.7 kg)  04/08/16 223 lb 12.8 oz (101.5 kg)    She is not longer on an iron supplement.   Review of Systems  Constitutional: Negative for unexpected weight change.       Has lost 20 pounds with diet/exercise  HENT: Negative for rhinorrhea.        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    Eyes: Negative for visual disturbance.  Respiratory: Negative for cough and shortness of breath.   Cardiovascular: Negative for chest pain.       Occasional ankle swelling  Gastrointestinal: Negative for blood in stool, constipation and diarrhea.  Genitourinary: Negative for dysuria and frequency.  Musculoskeletal: Negative for arthralgias and myalgias.  Skin: Negative for rash.       Granuloma anulaire- left dorsal hand.    Neurological: Negative for headaches.       Some tension headaches- intermittent  Hematological: Negative for adenopathy.  Psychiatric/Behavioral:       Denies depression/anxiety       Past Medical History:  Diagnosis Date  . Diverticulosis   . Genital warts   . History of chicken pox   . Hypertension   . Osteopenia 02/18/2012  . Personal history of colonic polyps      Social History   Social History  . Marital status: Married    Spouse name: N/A  . Number of children: 2  . Years of education: N/A   Occupational History  . Not on file.   Social History Main Topics  . Smoking status: Former Smoker    Quit date: 08/16/2013  .  Smokeless tobacco: Never Used     Comment: Pt states she "vapes".  . Alcohol use No  . Drug use: No  . Sexual activity: Not on file   Other Topics Concern  . Not on file   Social History Narrative   Regular exercise:  No, walks dog daily   Caffeine Use:  12oz pepsi daily   Works as a Haematologist.   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   Married              Past Surgical History:  Procedure Laterality Date  . ABDOMINAL HYSTERECTOMY  1999   partial  . TUBAL LIGATION      Family History  Problem Relation Age of Onset  . Cancer Mother     lung  . Emphysema Father   . Stroke Father   . Seizures Father   . Heart disease Father   . Hypertension Father   . Colon cancer Maternal Aunt 65    Allergies  Allergen Reactions  . Amoxicillin Other (See Comments)    Itching of hands and feet    Current Outpatient Prescriptions on File Prior to Visit  Medication Sig Dispense Refill  . aspirin 81 MG tablet Take 324 mg by mouth daily.    . calcium carbonate (OS-CAL) 600 MG  TABS tablet Take 600 mg by mouth 2 (two) times daily with a meal. Pt taking one tablet a day    . Cholecalciferol (VITAMIN D3) 2000 UNITS TABS Take 1 tablet by mouth daily. Pt's tablets are 1000mg  each.    . Flaxseed, Linseed, (FLAX SEED OIL PO) Take 1 capsule by mouth daily.    . hydrocortisone-pramoxine (PROCTOFOAM HC) rectal foam Place 1 applicator rectally 2 (two) times daily. 10 g 2  . lansoprazole (PREVACID) 15 MG capsule Take 1 capsule (15 mg total) by mouth daily at 12 noon. 30 capsule 5  . Omega-3 Fatty Acids (FISH OIL) 1200 MG CAPS Take 2 capsules by mouth daily.    . Pitavastatin Calcium (LIVALO) 4 MG TABS Take 1 tablet (4 mg total) by mouth daily. 30 tablet 5  . Probiotic Product (PROBIOTIC DAILY) CAPS Take by mouth.    . vitamin B-12 (CYANOCOBALAMIN) 1000 MCG tablet Take 1,000 mcg by mouth daily. chewable    . zolpidem (AMBIEN) 10 MG tablet Take 1 tablet (10 mg total)  by mouth at bedtime as needed. for sleep 30 tablet 0   No current facility-administered medications on file prior to visit.     BP 107/72 (BP Location: Left Arm, Cuff Size: Normal)   Pulse 78   Temp 98.8 F (37.1 C) (Oral)   Resp 16   Ht 5\' 3"  (1.6 m)   Wt 202 lb (91.6 kg)   SpO2 99% Comment: room air  BMI 35.78 kg/m    Objective:   Physical Exam  Physical Exam  Constitutional: She is oriented to person, place, and time. She appears well-developed and well-nourished. No distress.  HENT:  Head: Normocephalic and atraumatic.  Right Ear: Tympanic membrane and ear canal normal.  Left Ear: Tympanic membrane and ear canal normal.  Mouth/Throat: Oropharynx is clear and moist.  Eyes: Pupils are equal, round, and reactive to light. No scleral icterus.  Neck: Normal range of motion. No thyromegaly present.  Cardiovascular: Normal rate and regular rhythm.   No murmur heard. Pulmonary/Chest: Effort normal and breath sounds normal. No respiratory distress. He has no wheezes. She has no rales. She exhibits no tenderness.  Abdominal: Soft. Bowel sounds are normal. She exhibits no distension and no mass. There is no tenderness. There is no rebound and no guarding.  Musculoskeletal: She exhibits no edema.  Lymphadenopathy:    She has no cervical adenopathy.  Neurological: She is alert and oriented to person, place, and time. She has normal patellar reflexes. She exhibits normal muscle tone. Coordination normal.  Skin: Skin is warm and dry.  Psychiatric: She has a normal mood and affect. Her behavior is normal. Judgment and thought content normal.  Breasts: Examined lying Right: Without masses, retractions, discharge or axillary adenopathy.  Left: Without masses, retractions, discharge or axillary adenopathy.         Assessment & Plan:     Preventative care- discussed healthy diet, exercise, weight loss. Obtain routine lab work.  She declines mammo and dexa.   EKG tracing is  personally reviewed.  EKG notes NSR.  No acute changes.      Assessment & Plan:

## 2016-10-09 ENCOUNTER — Telehealth: Payer: Self-pay | Admitting: Family

## 2016-10-09 DIAGNOSIS — R3129 Other microscopic hematuria: Secondary | ICD-10-CM

## 2016-10-09 MED ORDER — NITROFURANTOIN MACROCRYSTAL 100 MG PO CAPS: 100.0000 mg | ORAL_CAPSULE | Freq: Two times a day (BID) | ORAL | 0 refills | Status: DC

## 2016-10-09 MED FILL — NITROFURANTOIN MCR 100 MG C: 100 | 7 days supply | Qty: 14 | Fill #0

## 2016-10-09 NOTE — Telephone Encounter (Signed)
Notified pt and she voices understanding. Lab appt scheduled for 10/23/16 at 7:15am. Future order entered.

## 2016-10-09 NOTE — Addendum Note (Signed)
Addended by: Kelle Darting A on: 10/09/2016 03:53 PM   Modules accepted: Orders

## 2016-10-09 NOTE — Telephone Encounter (Signed)
Urine shows blood and ? Uti. I would like her to take macrobid for 1 week, repeat UA with micro in 2 weeks, dx microscopic hematuria.   Iron level looks good. OK to remain off of iron supplement.  Kidney function, liver function, cholesterol and blood count look good.

## 2016-10-10 ENCOUNTER — Encounter: Payer: Self-pay | Admitting: Family

## 2016-10-23 ENCOUNTER — Ambulatory Visit (INDEPENDENT_AMBULATORY_CARE_PROVIDER_SITE_OTHER): Payer: Managed Care, Other (non HMO) | Admitting: Family

## 2016-10-23 ENCOUNTER — Other Ambulatory Visit (INDEPENDENT_AMBULATORY_CARE_PROVIDER_SITE_OTHER): Payer: Managed Care, Other (non HMO)

## 2016-10-23 ENCOUNTER — Ambulatory Visit (HOSPITAL_BASED_OUTPATIENT_CLINIC_OR_DEPARTMENT_OTHER)
Admission: RE | Admit: 2016-10-23 | Discharge: 2016-10-23 | Disposition: A | Discharge status: Home / Self Care | Payer: Managed Care, Other (non HMO) | Source: Ambulatory Visit | Attending: Family | Admitting: Family

## 2016-10-23 ENCOUNTER — Encounter: Payer: Self-pay | Admitting: Family

## 2016-10-23 VITALS — BP 100/80 | HR 83 | Temp 98.8°F | Resp 16 | Ht 63.0 in | Wt 203.2 lb

## 2016-10-23 DIAGNOSIS — R109 Unspecified abdominal pain: Secondary | ICD-10-CM

## 2016-10-23 DIAGNOSIS — G8929 Other chronic pain: Secondary | ICD-10-CM | POA: Diagnosis not present

## 2016-10-23 DIAGNOSIS — R3129 Other microscopic hematuria: Secondary | ICD-10-CM

## 2016-10-23 DIAGNOSIS — M545 Low back pain, unspecified: Secondary | ICD-10-CM

## 2016-10-23 DIAGNOSIS — M47817 Spondylosis without myelopathy or radiculopathy, lumbosacral region: Secondary | ICD-10-CM | POA: Insufficient documentation

## 2016-10-23 LAB — URINALYSIS, ROUTINE W REFLEX MICROSCOPIC
Bilirubin Urine: NEGATIVE
Ketones, ur: NEGATIVE
LEUKOCYTES UA: NEGATIVE
Nitrite: NEGATIVE
PH: 6 (ref 5.0–8.0)
RBC / HPF: NONE SEEN (ref 0–?)
Total Protein, Urine: NEGATIVE
URINE GLUCOSE: NEGATIVE
Urobilinogen, UA: 0.2 (ref 0.0–1.0)

## 2016-10-23 LAB — POCT URINALYSIS DIPSTICK
BILIRUBIN UA: NEGATIVE
Glucose, UA: NEGATIVE
KETONES UA: NEGATIVE
LEUKOCYTES UA: NEGATIVE
Nitrite, UA: NEGATIVE
PH UA: 6
Protein, UA: NEGATIVE
SPEC GRAV UA: 1.015
Urobilinogen, UA: NEGATIVE

## 2016-10-23 MED ORDER — HYDROCORTISONE ACE-PRAMOXINE 1-1 % RE FOAM: 1.0000 | Freq: Two times a day (BID) | RECTAL | 2 refills | Status: DC

## 2016-10-23 MED FILL — PROCTOFOAM-HC 1%-1% FOAM: 1-1 | 15 days supply | Qty: 10 | Fill #0

## 2016-10-23 NOTE — Patient Instructions (Addendum)
Complete CT scan on the first floor. Take 1 cap of miralax in 8 oz of water or juice this evening for constipation. You may use a fleets enema as needed.  Avoid tums due to constipation.   We will contact you with your results.

## 2016-10-23 NOTE — Progress Notes (Signed)
Subjective:    Patient ID: Dawn Huber, female    DOB: [birth year], 60 y.o.   MRN: [id number]  HPI   Dawn Huber is a 60 yr old female who presents today with chief complaint of back pain. Back pain began around 10/07/16.  Notes that her back pain has worsened since that time. She reports + nausea, + GERD symptoms.  Has some "pinch" left anterior rib cage.  Some cramping in the right lower quadrant. Pain located across her back. She denies dysuria.  Denies gross hematuria.    Constipation- has been using tums and notes that she had bad constipation this AM.  She reports that she strained for 30 minutes. She manual disimpacted herself.  She has had some nausea.  She reports that she had some blood with wiping after trauma from impaction. Area is still reportedly sore and swollen.     Review of Systems See HPI  Past Medical History:  Diagnosis Date  . Diverticulosis   . Genital warts   . History of chicken pox   . Hypertension   . Osteopenia 02/18/2012  . Personal history of colonic polyps      Social History   Social History  . Marital status: Married    Spouse name: N/A  . Number of children: 2  . Years of education: N/A   Occupational History  . Not on file.   Social History Main Topics  . Smoking status: Former Smoker    Quit date: 08/16/2013  . Smokeless tobacco: Never Used     Comment: Pt states she "vapes".  . Alcohol use No  . Drug use: No  . Sexual activity: Not on file   Other Topics Concern  . Not on file   Social History Narrative   Regular exercise:  No, walks dog daily   Caffeine Use:  12oz pepsi daily   Works as a Haematologist.   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   Married              Past Surgical History:  Procedure Laterality Date  . ABDOMINAL HYSTERECTOMY  1999   partial  . TUBAL LIGATION      Family History  Problem Relation Age of Onset  . Cancer Mother     lung  . Emphysema Father   .  Stroke Father   . Seizures Father   . Heart disease Father   . Hypertension Father   . Colon cancer Maternal Aunt 65    Allergies  Allergen Reactions  . Amoxicillin Other (See Comments)    Itching of hands and feet    Current Outpatient Prescriptions on File Prior to Visit  Medication Sig Dispense Refill  . aspirin 81 MG tablet Take 324 mg by mouth daily.    . calcium carbonate (OS-CAL) 600 MG TABS tablet Take 600 mg by mouth 2 (two) times daily with a meal. Pt taking one tablet a day    . Cholecalciferol (VITAMIN D3) 2000 UNITS TABS Take 1 tablet by mouth daily. Pt's tablets are 1000mg  each.    . Flaxseed, Linseed, (FLAX SEED OIL PO) Take 1 capsule by mouth daily.    . lansoprazole (PREVACID) 15 MG capsule Take 1 capsule (15 mg total) by mouth daily at 12 noon. 30 capsule 5  . Omega-3 Fatty Acids (FISH OIL) 1200 MG CAPS Take 2 capsules by mouth daily.    . Pitavastatin Calcium (LIVALO) 4 MG TABS Take  1 tablet (4 mg total) by mouth daily. 30 tablet 5  . Probiotic Product (PROBIOTIC DAILY) CAPS Take by mouth.    . vitamin B-12 (CYANOCOBALAMIN) 1000 MCG tablet Take 1,000 mcg by mouth daily. chewable    . zolpidem (AMBIEN) 10 MG tablet Take 1 tablet (10 mg total) by mouth at bedtime as needed. for sleep 30 tablet 0   No current facility-administered medications on file prior to visit.     BP 100/80 (BP Location: Right Arm, Cuff Size: Large)   Pulse 83   Temp 98.8 F (37.1 C) (Oral)   Resp 16   Ht 5\' 3"  (1.6 m)   Wt 203 lb 3.2 oz (92.2 kg)   SpO2 98% Comment: room air  BMI 36.00 kg/m       Objective:   Physical Exam  Constitutional: She appears well-developed and well-nourished.  Cardiovascular: Normal rate, regular rhythm and normal heart sounds.   No murmur heard. Pulmonary/Chest: Effort normal and breath sounds normal. No respiratory distress. She has no wheezes.  Abdominal: Soft. Bowel sounds are normal.  Mild RLQ tenderness  Psychiatric: Her behavior is normal.  Judgment and thought content normal.  Briefly tearful during interview          Assessment & Plan:  Hematuria- obtain CT to further evaluate and rule out kidney stone. Will send UA for microscopic and culture as well.    Abdominal pain-  Obtain CT abd/pelvis to further evaluate.  Need to evaluate gallbladder due to RUQ pain and appendix due to RLQ pain.    Constipation- discussed sitz baths to help with inflamed rectal tissues. Advised pt as follows:   Take 1 cap of miralax in 8 oz of water or juice this evening for constipation. You may use a fleets enema as needed.  Avoid tums due to constipation.    Low back pain- will obtain x ray of the lumbar spine to further evaluate.

## 2016-10-23 NOTE — Progress Notes (Signed)
Pre visit review using our clinic review tool, if applicable. No additional management support is needed unless otherwise documented below in the visit note. 

## 2016-10-23 NOTE — Addendum Note (Signed)
Addended by: Debbrah Alar on: 10/23/2016 02:23 PM   Modules accepted: Orders

## 2016-10-24 ENCOUNTER — Encounter: Payer: Self-pay | Admitting: Family

## 2016-10-25 ENCOUNTER — Ambulatory Visit (HOSPITAL_BASED_OUTPATIENT_CLINIC_OR_DEPARTMENT_OTHER): Payer: Managed Care, Other (non HMO)

## 2016-10-25 ENCOUNTER — Other Ambulatory Visit: Payer: Self-pay | Admitting: Family Medicine

## 2016-10-25 LAB — URINE CULTURE: ORGANISM ID, BACTERIA: NO GROWTH

## 2016-10-28 ENCOUNTER — Encounter: Payer: Self-pay | Admitting: Family

## 2016-10-31 ENCOUNTER — Other Ambulatory Visit: Payer: Self-pay | Admitting: Family

## 2016-10-31 ENCOUNTER — Encounter: Payer: Self-pay | Admitting: Family

## 2016-11-01 ENCOUNTER — Telehealth: Payer: Self-pay | Admitting: Family

## 2016-11-01 MED FILL — ZOLPIDEM TARTRATE 10 MG TAB: 10 | 15 days supply | Qty: 15 | Fill #0

## 2016-11-01 MED FILL — LIVALO 4 MG TABLET: 4 | 30 days supply | Qty: 30 | Fill #0 | Status: TO

## 2016-11-01 MED FILL — LANSOPRAZOLE DR 15 MG CAP: 15 | 30 days supply | Qty: 30 | Fill #0 | Status: TO

## 2016-11-01 NOTE — Telephone Encounter (Signed)
Relation to [id number] Call back [place]:  [place], [place] - [address] [phone] (Phone) [phone] (Fax)    Reason for call:  Patient requesting a refill lansoprazole (PREVACID) 15 MG capsule, zolpidem (AMBIEN) 10 MG tablet and Pitavastatin Calcium (LIVALO) 4 MG TABS

## 2016-11-01 NOTE — Telephone Encounter (Signed)
Rx printed, awaiting PA signature.

## 2016-11-01 NOTE — Telephone Encounter (Signed)
Received electronic Rx requests. Rx's sent to pharmacy, minus Ambien. Awaiting okay for refill.

## 2016-11-01 NOTE — Telephone Encounter (Signed)
Will grant Rx for 15 tablets in PCP absence.

## 2016-11-01 NOTE — Telephone Encounter (Signed)
Rx faxed to [facility] pharmacy.  

## 2016-11-01 NOTE — Telephone Encounter (Signed)
Pt is requesting refill on Ambien. Melissa;s Pt.  Last OV: 10/23/2016 Last Fill: 09/30/2016 #30 and 0RF  Please advise.

## 2016-11-29 ENCOUNTER — Encounter: Payer: Self-pay | Admitting: Family

## 2016-11-29 MED ORDER — ZOLPIDEM TARTRATE 10 MG PO TABS: 10.0000 mg | ORAL_TABLET | Freq: Every evening | ORAL | 0 refills | Status: DC | PRN

## 2016-12-23 DIAGNOSIS — Z5189 Encounter for other specified aftercare: Secondary | ICD-10-CM

## 2016-12-23 HISTORY — DX: Encounter for other specified aftercare: Z51.89

## 2017-01-01 ENCOUNTER — Other Ambulatory Visit: Payer: Self-pay | Admitting: Family

## 2017-01-01 MED ORDER — ZOLPIDEM TARTRATE 10 MG PO TABS: 10.0000 mg | ORAL_TABLET | Freq: Every evening | ORAL | 0 refills | Status: DC | PRN

## 2017-01-01 NOTE — Telephone Encounter (Signed)
Rx faxed to pharmacy, notified pt. 

## 2017-01-01 NOTE — Telephone Encounter (Signed)
Last zolpidem Rx:  11/29/16, #30 Last OV: 09/2016 Next OV:  04/07/17 UDS: 09/2016  Rx printed and forwarded to PCP for signature.

## 2017-01-02 ENCOUNTER — Other Ambulatory Visit: Payer: Self-pay | Admitting: Family

## 2017-01-03 NOTE — Telephone Encounter (Signed)
Received call from Gay at [facility] stating pt was there to pick up  Zolpidem Rx and they did not receive it. Verbal given per 01/01/17 rx.

## 2017-01-29 ENCOUNTER — Telehealth: Payer: Self-pay | Admitting: Family

## 2017-01-29 MED ORDER — OSELTAMIVIR PHOSPHATE 75 MG PO CAPS: 75.0000 mg | ORAL_CAPSULE | Freq: Every day | ORAL | 0 refills | Status: DC

## 2017-01-29 NOTE — Telephone Encounter (Signed)
Pt's husband in today and diagnosed with flu.  Will send rx for patient for prophylactic tamiflu. Husband will notify pt and I have also sent a mychart message.

## 2017-02-03 ENCOUNTER — Other Ambulatory Visit: Payer: Self-pay | Admitting: Family

## 2017-02-03 ENCOUNTER — Encounter: Payer: Self-pay | Admitting: Family

## 2017-02-03 MED ORDER — ZOLPIDEM TARTRATE 10 MG PO TABS: 10.0000 mg | ORAL_TABLET | Freq: Every evening | ORAL | 0 refills | Status: DC | PRN

## 2017-02-03 NOTE — Telephone Encounter (Signed)
Requested Prescriptions   Pending Prescriptions Disp Refills  . zolpidem (AMBIEN) 10 MG tablet 30 tablet 0    Sig: Take 1 tablet (10 mg total) by mouth at bedtime as needed for sleep.   Last RX:  01/01/17, #30 Last OV:  10/23/16 Next OV:  03/23/17 UDS:  09/27/16, moderate  Rx called to pharmacy voicemail.

## 2017-03-05 ENCOUNTER — Other Ambulatory Visit: Payer: Self-pay | Admitting: Family

## 2017-03-05 MED ORDER — ZOLPIDEM TARTRATE 10 MG PO TABS: 10.0000 mg | ORAL_TABLET | Freq: Every evening | ORAL | 0 refills | Status: DC | PRN

## 2017-03-05 NOTE — Telephone Encounter (Signed)
Last RX:  02/03/17, #30 Last OV:  10/23/16 Next OV:  04/07/17 UDS:  10/07/16 moderate.  Will need to obtain at upcoming appt in April.  Rx printed and forwarded to PCP for signature.Marland Kitchen

## 2017-03-05 NOTE — Telephone Encounter (Signed)
Rx faxed to pharmacy  

## 2017-03-23 DIAGNOSIS — T39395A Adverse effect of other nonsteroidal anti-inflammatory drugs [NSAID], initial encounter: Secondary | ICD-10-CM

## 2017-03-23 DIAGNOSIS — K922 Gastrointestinal hemorrhage, unspecified: Secondary | ICD-10-CM

## 2017-03-23 HISTORY — DX: Adverse effect of other nonsteroidal anti-inflammatory drugs (NSAID), initial encounter: T39.395A

## 2017-03-23 HISTORY — DX: Adverse effect of other nonsteroidal anti-inflammatory drugs (NSAID), initial encounter: K92.2

## 2017-04-07 ENCOUNTER — Encounter: Payer: Self-pay | Admitting: Family

## 2017-04-07 ENCOUNTER — Ambulatory Visit (INDEPENDENT_AMBULATORY_CARE_PROVIDER_SITE_OTHER): Payer: Managed Care, Other (non HMO) | Admitting: Family

## 2017-04-07 ENCOUNTER — Other Ambulatory Visit: Payer: Self-pay | Admitting: Family

## 2017-04-07 DIAGNOSIS — K219 Gastro-esophageal reflux disease without esophagitis: Secondary | ICD-10-CM

## 2017-04-07 DIAGNOSIS — D649 Anemia, unspecified: Secondary | ICD-10-CM | POA: Diagnosis not present

## 2017-04-07 DIAGNOSIS — G4700 Insomnia, unspecified: Secondary | ICD-10-CM

## 2017-04-07 DIAGNOSIS — E785 Hyperlipidemia, unspecified: Secondary | ICD-10-CM

## 2017-04-07 MED ORDER — ZOLPIDEM TARTRATE 10 MG PO TABS: 10.0000 mg | ORAL_TABLET | Freq: Every evening | ORAL | 0 refills | Status: DC | PRN

## 2017-04-07 NOTE — Assessment & Plan Note (Signed)
Reports sleeping well. Continue ambien. Obtain UDS. Advised pt that if she chooses to stay on controlled medication, she will need to continue to do intermittent uds.

## 2017-04-07 NOTE — Assessment & Plan Note (Signed)
Stable. Trial off of PPI.

## 2017-04-07 NOTE — Patient Instructions (Addendum)
Try holding prevacid and seeing how you do.

## 2017-04-07 NOTE — Telephone Encounter (Signed)
Rx faxed to pharmacy at 8:10am.

## 2017-04-07 NOTE — Assessment & Plan Note (Signed)
At goal, continue livalo.

## 2017-04-07 NOTE — Progress Notes (Signed)
Pre visit review using our clinic review tool, if applicable. No additional management support is needed unless otherwise documented below in the visit note. 

## 2017-04-07 NOTE — Telephone Encounter (Signed)
Rx printed and forwarded to PCP for signature. 

## 2017-04-07 NOTE — Assessment & Plan Note (Signed)
Clinically stable. Monitor. 

## 2017-04-07 NOTE — Progress Notes (Signed)
Subjective:    Patient ID: Dawn Huber, female    DOB: 08/10/1956, 61 y.o.   MRN: [id number]  HPI  Dawn Huber is a 61 yr old female who presents today for follow up.  1) Insomnia- currently maintained on ambien. Reports that she sleeps well with ambien.    2) Hyperlipidemia- maintained on livalo.  Lab Results  Component Value Date   CHOL 154 10/07/2016   HDL 58.90 10/07/2016   LDLCALC 71 10/07/2016   LDLDIRECT 172.0 07/10/2015   TRIG 120.0 10/07/2016   CHOLHDL 3 10/07/2016   3) Anemia-  Not on supplement. Follow up iron level was normal.  Lab Results  Component Value Date   WBC 7.5 10/07/2016   HGB 13.0 10/07/2016   HCT 39.2 10/07/2016   MCV 90.2 10/07/2016   PLT 219.0 10/07/2016   4) GERD- reports that her symptoms.     Review of Systems See HPI  Past Medical History:  Diagnosis Date  . Diverticulosis   . Genital warts   . History of chicken pox   . Hypertension   . Osteopenia 02/18/2012  . Personal history of colonic polyps      Social History   Social History  . Marital status: Married    Spouse name: N/A  . Number of children: 2  . Years of education: N/A   Occupational History  . Not on file.   Social History Main Topics  . Smoking status: Former Smoker    Quit date: 08/16/2013  . Smokeless tobacco: Never Used     Comment: Pt states she "vapes".  . Alcohol use No  . Drug use: No  . Sexual activity: Not on file   Other Topics Concern  . Not on file   Social History Narrative   Regular exercise:  No, walks dog daily   Caffeine Use:  12oz pepsi daily   Works as a Haematologist.   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   Married              Past Surgical History:  Procedure Laterality Date  . ABDOMINAL HYSTERECTOMY  1999   partial  . TUBAL LIGATION      Family History  Problem Relation Age of Onset  . Cancer Mother     lung  . Emphysema Father   . Stroke Father   . Seizures Father   . Heart  disease Father   . Hypertension Father   . Colon cancer Maternal Aunt 65    Allergies  Allergen Reactions  . Amoxicillin Other (See Comments)    Itching of hands and feet    Current Outpatient Prescriptions on File Prior to Visit  Medication Sig Dispense Refill  . aspirin 81 MG tablet Take 324 mg by mouth daily.    . calcium carbonate (OS-CAL) 600 MG TABS tablet Take 600 mg by mouth 2 (two) times daily with a meal. Pt taking one tablet a day    . Cholecalciferol (VITAMIN D3) 2000 UNITS TABS Take 1 tablet by mouth daily. Pt's tablets are 1000mg  each.    . Flaxseed, Linseed, (FLAX SEED OIL PO) Take 1 capsule by mouth daily.    . hydrocortisone-pramoxine (PROCTOFOAM HC) rectal foam Place 1 applicator rectally 2 (two) times daily. 10 g 2  . Omega-3 Fatty Acids (FISH OIL) 1200 MG CAPS Take 2 capsules by mouth daily.    Marland Kitchen oseltamivir (TAMIFLU) 75 MG capsule Take 1 capsule (75 mg  total) by mouth daily. 10 capsule 0  . Pitavastatin Calcium (LIVALO) 4 MG TABS Take 1 tablet (4 mg total) by mouth daily. 30 tablet 5  . Probiotic Product (PROBIOTIC DAILY) CAPS Take by mouth.    . vitamin B-12 (CYANOCOBALAMIN) 1000 MCG tablet Take 1,000 mcg by mouth daily. chewable    . zolpidem (AMBIEN) 10 MG tablet Take 1 tablet (10 mg total) by mouth at bedtime as needed for sleep. 30 tablet 0   No current facility-administered medications on file prior to visit.     BP 130/73 (BP Location: Right Arm, Cuff Size: Large)   Pulse 81   Temp 98.2 F (36.8 C) (Oral)   Resp 16   Ht 5\' 3"  (1.6 m)   Wt 201 lb 9.6 oz (91.4 kg)   SpO2 99% Comment: room air  BMI 35.71 kg/m       Objective:   Physical Exam  Constitutional: She is oriented to person, place, and time. She appears well-developed and well-nourished.  HENT:  Head: Normocephalic and atraumatic.  Cardiovascular: Normal rate, regular rhythm and normal heart sounds.   No murmur heard. Pulmonary/Chest: Effort normal and breath sounds normal. No  respiratory distress. She has no wheezes.  Musculoskeletal: She exhibits no edema.  Neurological: She is alert and oriented to person, place, and time.  Psychiatric: She has a normal mood and affect. Her behavior is normal. Judgment and thought content normal.          Assessment & Plan:

## 2017-04-08 ENCOUNTER — Encounter: Payer: Self-pay | Admitting: Family

## 2017-04-13 ENCOUNTER — Telehealth: Payer: Self-pay | Admitting: Family

## 2017-04-13 NOTE — Telephone Encounter (Signed)
Please contact patient to arrange hospital follow up with me within 1 week.

## 2017-04-14 NOTE — Telephone Encounter (Signed)
Pt has been scheduled.  °

## 2017-04-18 ENCOUNTER — Ambulatory Visit (INDEPENDENT_AMBULATORY_CARE_PROVIDER_SITE_OTHER): Payer: Managed Care, Other (non HMO) | Admitting: Family

## 2017-04-18 ENCOUNTER — Encounter: Payer: Self-pay | Admitting: Family

## 2017-04-18 VITALS — BP 131/75 | HR 71 | Temp 98.2°F | Resp 16 | Ht 63.0 in | Wt 199.2 lb

## 2017-04-18 DIAGNOSIS — D649 Anemia, unspecified: Secondary | ICD-10-CM | POA: Diagnosis not present

## 2017-04-18 DIAGNOSIS — K2971 Gastritis, unspecified, with bleeding: Secondary | ICD-10-CM | POA: Diagnosis not present

## 2017-04-18 LAB — COMPREHENSIVE METABOLIC PANEL
ALT: 11 U/L (ref 6–29)
AST: 18 U/L (ref 10–35)
Albumin: 4.2 g/dL (ref 3.6–5.1)
Alkaline Phosphatase: 52 U/L (ref 33–130)
BUN: 9 mg/dL (ref 7–25)
CO2: 24 mmol/L (ref 20–31)
Calcium: 9.4 mg/dL (ref 8.6–10.4)
Chloride: 105 mmol/L (ref 98–110)
Creat: 0.7 mg/dL (ref 0.50–1.05)
GLUCOSE: 83 mg/dL (ref 65–99)
POTASSIUM: 4.6 mmol/L (ref 3.5–5.3)
Sodium: 141 mmol/L (ref 135–146)
Total Bilirubin: 0.4 mg/dL (ref 0.2–1.2)
Total Protein: 6.7 g/dL (ref 6.1–8.1)

## 2017-04-18 LAB — CBC WITH DIFFERENTIAL/PLATELET
BASOS PCT: 1 %
Basophils Absolute: 63 cells/uL (ref 0–200)
EOS ABS: 126 {cells}/uL (ref 15–500)
Eosinophils Relative: 2 %
HCT: 32.5 % — ABNORMAL LOW (ref 35.0–45.0)
Hemoglobin: 10.1 g/dL — ABNORMAL LOW (ref 11.7–15.5)
LYMPHS PCT: 40 %
Lymphs Abs: 2520 cells/uL (ref 850–3900)
MCH: 25.1 pg — ABNORMAL LOW (ref 27.0–33.0)
MCHC: 31.1 g/dL — ABNORMAL LOW (ref 32.0–36.0)
MCV: 80.6 fL (ref 80.0–100.0)
MONOS PCT: 9 %
MPV: 9.8 fL (ref 7.5–12.5)
Monocytes Absolute: 567 cells/uL (ref 200–950)
Neutro Abs: 3024 cells/uL (ref 1500–7800)
Neutrophils Relative %: 48 %
PLATELETS: 289 10*3/uL (ref 140–400)
RBC: 4.03 MIL/uL (ref 3.80–5.10)
RDW: 20.5 % — AB (ref 11.0–15.0)
WBC: 6.3 10*3/uL (ref 3.8–10.8)

## 2017-04-18 NOTE — Patient Instructions (Addendum)
Please complete lab work prior to leaving.  Continue to avoid aspirin containing products, and NSAIDS (ibuprofen/aleve etc.) If recurrent vomiting with "coffee ground" appearance or black/tar-like stools, shortness of breath, extreme fatigue please return to the ER.  You will be contacted about your referral to hematology.

## 2017-04-18 NOTE — Progress Notes (Signed)
Pre visit review using our clinic review tool, if applicable. No additional management support is needed unless otherwise documented below in the visit note. 

## 2017-04-18 NOTE — Assessment & Plan Note (Signed)
Obtain follow up CBC.  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 She has now discontinued NSAIDS and ASA containing products. Refer to hematology for further evaluation.  Continue iron supplement.

## 2017-04-18 NOTE — Addendum Note (Signed)
Addended by: Caffie Pinto on: 04/18/2017 03:20 PM   Modules accepted: Orders

## 2017-04-18 NOTE — Progress Notes (Signed)
Subjective:    Patient ID: Dawn Huber, female    DOB: [birth year], 61 y.o.   MRN: [id number]  HPI  Dawn Huber is a 61 yr old female who presents today for hospital follow up. Hospital record is reviewed  She was admitted 4/18 with acute GI bleed/hematemesis, melena.  Had EGD 04/09/17 which noted gastric erosions/cameron's lesions.  Hemoglobin at time of admission was 6.  She received 4 U of PRBC. She also had syncope prior to her admission which was felt to be secondary to anemia.    The patient admits to taking frequent ibuprofen prior to her admission due to back pain.   She reports that she had been taking iron. Denies abdominal pain. Stools have returned to normal color/consistency. She has discontinued aspirin containing products   Review of Systems See HPI  Past Medical History:  Diagnosis Date  . Diverticulosis   . Genital warts   . History of chicken pox   . Hypertension   . Osteopenia 02/18/2012  . Personal history of colonic polyps      Social History   Social History  . Marital status: Married    Spouse name: N/A  . Number of children: 2  . Years of education: N/A   Occupational History  . Not on file.   Social History Main Topics  . Smoking status: Former Smoker    Quit date: 08/16/2013  . Smokeless tobacco: Never Used     Comment: Pt states she "vapes".  . Alcohol use No  . Drug use: No  . Sexual activity: Not on file   Other Topics Concern  . Not on file   Social History Narrative   Regular exercise:  No, walks dog daily   Caffeine Use:  12oz pepsi daily   Works as a Haematologist.   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   Married              Past Surgical History:  Procedure Laterality Date  . ABDOMINAL HYSTERECTOMY  1999   partial  . TUBAL LIGATION      Family History  Problem Relation Age of Onset  . Cancer Mother     lung  . Emphysema Father   . Stroke Father   . Seizures Father   . Heart  disease Father   . Hypertension Father   . Colon cancer Maternal Aunt 65    Allergies  Allergen Reactions  . Amoxicillin Other (See Comments)    Itching of hands and feet    Current Outpatient Prescriptions on File Prior to Visit  Medication Sig Dispense Refill  . calcium carbonate (OS-CAL) 600 MG TABS tablet Take 600 mg by mouth 2 (two) times daily with a meal. Pt taking one tablet a day    . Cholecalciferol (VITAMIN D3) 2000 UNITS TABS Take 1 tablet by mouth daily. Pt's tablets are 1000mg  each.    . Flaxseed, Linseed, (FLAX SEED OIL PO) Take 1 capsule by mouth daily.    . hydrocortisone-pramoxine (PROCTOFOAM HC) rectal foam Place 1 applicator rectally 2 (two) times daily. 10 g 2  . Omega-3 Fatty Acids (FISH OIL) 1200 MG CAPS Take 2 capsules by mouth daily.    . Pitavastatin Calcium (LIVALO) 4 MG TABS Take 1 tablet (4 mg total) by mouth daily. 30 tablet 5  . vitamin B-12 (CYANOCOBALAMIN) 1000 MCG tablet Take 1,000 mcg by mouth daily. chewable    . zolpidem (AMBIEN) 10  MG tablet Take 1 tablet (10 mg total) by mouth at bedtime as needed for sleep. 30 tablet 0   No current facility-administered medications on file prior to visit.     BP 131/75 (BP Location: Right Arm, Cuff Size: Large)   Pulse 71   Temp 98.2 F (36.8 C) (Oral)   Resp 16   Ht 5\' 3"  (1.6 m)   Wt 199 lb 3.2 oz (90.4 kg)   SpO2 99% Comment: room air  BMI 35.29 kg/m       Objective:   Physical Exam  Constitutional: She is oriented to person, place, and time. She appears well-developed and well-nourished.  HENT:  Head: Normocephalic and atraumatic.  Cardiovascular: Normal rate, regular rhythm and normal heart sounds.   No murmur heard. Pulmonary/Chest: Effort normal and breath sounds normal. No respiratory distress. She has no wheezes.  Abdominal: Soft. She exhibits no distension. There is no tenderness. There is no rebound.  Neurological: She is alert and oriented to person, place, and time.  Psychiatric: She  has a normal mood and affect. Her behavior is normal. Judgment and thought content normal.          Assessment & Plan:  GI bleed- clinically resolved. Continue PPI.

## 2017-04-20 ENCOUNTER — Encounter: Payer: Self-pay | Admitting: Family

## 2017-04-28 ENCOUNTER — Encounter: Payer: Self-pay | Admitting: Family

## 2017-04-29 ENCOUNTER — Encounter: Payer: Self-pay | Admitting: Family

## 2017-05-01 ENCOUNTER — Other Ambulatory Visit: Payer: Managed Care, Other (non HMO)

## 2017-05-01 ENCOUNTER — Other Ambulatory Visit: Payer: Self-pay | Admitting: Family

## 2017-05-01 ENCOUNTER — Ambulatory Visit: Payer: Managed Care, Other (non HMO)

## 2017-05-01 ENCOUNTER — Ambulatory Visit: Payer: Managed Care, Other (non HMO) | Admitting: Family

## 2017-05-01 DIAGNOSIS — D509 Iron deficiency anemia, unspecified: Secondary | ICD-10-CM | POA: Insufficient documentation

## 2017-05-01 DIAGNOSIS — D500 Iron deficiency anemia secondary to blood loss (chronic): Secondary | ICD-10-CM

## 2017-05-02 ENCOUNTER — Ambulatory Visit (HOSPITAL_BASED_OUTPATIENT_CLINIC_OR_DEPARTMENT_OTHER): Payer: Managed Care, Other (non HMO) | Admitting: Family

## 2017-05-02 ENCOUNTER — Ambulatory Visit: Payer: Managed Care, Other (non HMO)

## 2017-05-02 ENCOUNTER — Other Ambulatory Visit: Payer: Self-pay | Admitting: Family

## 2017-05-02 ENCOUNTER — Other Ambulatory Visit (HOSPITAL_BASED_OUTPATIENT_CLINIC_OR_DEPARTMENT_OTHER): Payer: Managed Care, Other (non HMO)

## 2017-05-02 VITALS — BP 117/67 | HR 66 | Temp 98.3°F | Resp 16 | Wt 198.8 lb

## 2017-05-02 DIAGNOSIS — D509 Iron deficiency anemia, unspecified: Secondary | ICD-10-CM | POA: Diagnosis not present

## 2017-05-02 DIAGNOSIS — K254 Chronic or unspecified gastric ulcer with hemorrhage: Secondary | ICD-10-CM | POA: Diagnosis not present

## 2017-05-02 DIAGNOSIS — D500 Iron deficiency anemia secondary to blood loss (chronic): Secondary | ICD-10-CM

## 2017-05-02 LAB — COMPREHENSIVE METABOLIC PANEL (CC13)
ALK PHOS: 59 IU/L (ref 39–117)
ALT: 11 IU/L (ref 0–32)
AST: 16 IU/L (ref 0–40)
Albumin, Serum: 4.4 g/dL (ref 3.5–5.5)
Albumin/Globulin Ratio: 1.8 (ref 1.2–2.2)
BUN / CREAT RATIO: 16 (ref 9–23)
BUN: 11 mg/dL (ref 6–24)
Bilirubin Total: 0.3 mg/dL (ref 0.0–1.2)
CHLORIDE: 103 mmol/L (ref 96–106)
CO2: 27 mmol/L (ref 18–29)
CREATININE: 0.69 mg/dL (ref 0.57–1.00)
Calcium, Ser: 9.7 mg/dL (ref 8.7–10.2)
GFR calc Af Amer: 110 mL/min/{1.73_m2} (ref >59)
GFR calc non Af Amer: 96 mL/min/{1.73_m2} (ref >59)
GLOBULIN, TOTAL: 2.4 g/dL (ref 1.5–4.5)
GLUCOSE: 88 mg/dL (ref 65–99)
Potassium, Ser: 4.2 mmol/L (ref 3.5–5.2)
Sodium: 135 mmol/L (ref 134–144)
Total Protein: 6.8 g/dL (ref 6.0–8.5)

## 2017-05-02 LAB — CBC WITH DIFFERENTIAL (CANCER CENTER ONLY)
BASO#: 0 10*3/uL (ref 0.0–0.2)
BASO%: 0.7 % (ref 0.0–2.0)
EOS ABS: 0.1 10*3/uL (ref 0.0–0.5)
EOS%: 2 % (ref 0.0–7.0)
HEMATOCRIT: 34.3 % — AB (ref 34.8–46.6)
HEMOGLOBIN: 11 g/dL — AB (ref 11.6–15.9)
LYMPH#: 2 10*3/uL (ref 0.9–3.3)
LYMPH%: 32.9 % (ref 14.0–48.0)
MCH: 26.6 pg (ref 26.0–34.0)
MCHC: 32.1 g/dL (ref 32.0–36.0)
MCV: 83 fL (ref 81–101)
MONO#: 0.6 10*3/uL (ref 0.1–0.9)
MONO%: 10.1 % (ref 0.0–13.0)
NEUT%: 54.3 % (ref 39.6–80.0)
NEUTROS ABS: 3.3 10*3/uL (ref 1.5–6.5)
Platelets: 244 10*3/uL (ref 145–400)
RBC: 4.14 10*6/uL (ref 3.70–5.32)
RDW: 21.6 % — ABNORMAL HIGH (ref 11.1–15.7)
WBC: 6 10*3/uL (ref 3.9–10.0)

## 2017-05-02 LAB — CHCC SATELLITE - SMEAR

## 2017-05-02 NOTE — Progress Notes (Signed)
Hematology/Oncology Consultation   Name: Dawn Huber      MRN: [id number]    Location: Room/bed info not found  Date: 05/02/2017 Time:4:43 PM   REFERRING PHYSICIAN: Debbrah Alar, NP  REASON FOR CONSULT: Iron deficiency anemia    DIAGNOSIS: Iron deficiency anemia   HISTORY OF PRESENT ILLNESS: [name] is a very pleasant 61 yo caucasian female with history of iron deficiency anemia. She ended up in the hospital at [facility] in mid April with a gastrointestinal hemorrhage with hematemesis and black tarry stools as well as 2 syncopal episodes. She had been taking ibuprofen and other NSAIDs for arthritic pain in her back. She was given 4 units of blood for Hgb of 6 and fluids.  EGD on 4/18 revealed gastric erosions/Cameron's lesions. She is currently followed by GI.  She is taking Protonix 20 mg PO BID and an oral iron supplement daily. Protonix may block the absorption of the iron tablet. We will see what iron studies show.  She states that her mother also had a history of intermittent iron deficiency anemia and took oral iron as needed.  No fever, chills, n/v, cough, rash, dizziness, SOB, chest pain, palpitations, abdominal pain or changes in bowel or bladder habits.  No lymphadenopathy found on exam. No other episodes of bleeding. No bruising or petechiae.  No swelling, tenderness, numbness or tingling in her extremities. No c/o pain at this time. She has maintained a good appetite and is eating iron and vitamin C rich foods. She is staying well hydrated. Her weight is stable.  She has two healthy grown children. No history of miscarriage. She had a partial hysterectomy years ago and still has "one ovary and one fallopian tube."  She was a former smoker for 40 years quitting 5 years ago. She smoked 1-2 ppd. No ETOH consumption.  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   ROS: All other 10 point review of systems is negative.    PAST MEDICAL HISTORY:   Past Medical History:  Diagnosis Date  . Diverticulosis   . Genital warts   . GI bleed due to NSAIDs 03/2017   hospitalized at [facility]  . History of chicken pox   . Hypertension   . Osteopenia 02/18/2012  . Personal history of colonic polyps     ALLERGIES: Allergies  Allergen Reactions  . Amoxicillin Other (See Comments)    Itching of hands and feet      MEDICATIONS:  Current Outpatient Prescriptions on File Prior to Visit  Medication Sig Dispense Refill  . calcium carbonate (OS-CAL) 600 MG TABS tablet Take 600 mg by mouth 2 (two) times daily with a meal. Pt taking one tablet a day    . Cholecalciferol (VITAMIN D3) 2000 UNITS TABS Take 1 tablet by mouth daily. Pt's tablets are 1000mg  each.    . ferrous sulfate 325 (65 FE) MG tablet Take 325 mg by mouth daily with breakfast.    . Flaxseed, Linseed, (FLAX SEED OIL PO) Take 1 capsule by mouth daily.    . hydrocortisone-pramoxine (PROCTOFOAM HC) rectal foam Place 1 applicator rectally 2 (two) times daily. 10 g 2  . Omega-3 Fatty Acids (FISH OIL) 1200 MG CAPS Take 2 capsules by mouth daily.    . pantoprazole (PROTONIX) 40 MG tablet Take 40 mg by mouth daily.    . vitamin B-12 (CYANOCOBALAMIN) 1000 MCG tablet Take 1,000 mcg by mouth daily. chewable    . zolpidem (  AMBIEN) 10 MG tablet Take 1 tablet (10 mg total) by mouth at bedtime as needed for sleep. 30 tablet 0   No current facility-administered medications on file prior to visit.      PAST SURGICAL HISTORY Past Surgical History:  Procedure Laterality Date  . ABDOMINAL HYSTERECTOMY  1999   partial  . TUBAL LIGATION      FAMILY HISTORY: Family History  Problem Relation Age of Onset  . Cancer Mother        lung  . Emphysema Father   . Stroke Father   . Seizures Father   . Heart disease Father   . Hypertension Father   . Colon cancer Maternal Aunt 29    SOCIAL HISTORY:  reports that she quit smoking about 3 years ago. She has never  used smokeless tobacco. She reports that she does not drink alcohol or use drugs.  PERFORMANCE STATUS: The patient's performance status is 1 - Symptomatic but completely ambulatory  PHYSICAL EXAM: Most Recent Vital Signs: Blood pressure 117/67, pulse 66, temperature 98.3 F (36.8 C), temperature source Oral, resp. rate 16, weight 198 lb 12.8 oz (90.2 kg), SpO2 100 %. BP 117/67 (BP Location: Right Arm, Patient Position: Sitting)   Pulse 66   Temp 98.3 F (36.8 C) (Oral)   Resp 16   Wt 198 lb 12.8 oz (90.2 kg)   SpO2 100%   BMI 35.22 kg/m   General Appearance:    Alert, cooperative, no distress, appears stated age  Head:    Normocephalic, without obvious abnormality, atraumatic  Eyes:    PERRL, conjunctiva/corneas clear, EOM's intact, fundi    benign, both eyes        Throat:   Lips, mucosa, and tongue normal; teeth and gums normal  Neck:   Supple, symmetrical, trachea midline, no adenopathy;    thyroid:  no enlargement/tenderness/nodules; no carotid   bruit or JVD  Back:     Symmetric, no curvature, ROM normal, no CVA tenderness  Lungs:     Clear to auscultation bilaterally, respirations unlabored  Chest Wall:    No tenderness or deformity   Heart:    Regular rate and rhythm, S1 and S2 normal, no murmur, rub   or gallop     Abdomen:     Soft, non-tender, bowel sounds active all four quadrants,    no masses, no organomegaly        Extremities:   Extremities normal, atraumatic, no cyanosis or edema  Pulses:   2+ and symmetric all extremities  Skin:   Skin color, texture, turgor normal, no rashes or lesions  Lymph nodes:   Cervical, supraclavicular, and axillary nodes normal  Neurologic:   CNII-XII intact, normal strength, sensation and reflexes    throughout    LABORATORY DATA:  Results for orders placed or performed in visit on 05/02/17 (from the past 48 hour(s))  CBC w/Diff     Status: Abnormal   Collection Time: 05/02/17  1:46 PM  Result Value Ref Range   WBC 6.0 3.9  - 10.0 10e3/uL   RBC 4.14 3.70 - 5.32 10e6/uL   HGB 11.0 (L) 11.6 - 15.9 g/dL   HCT 34.3 (L) 34.8 - 46.6 %   MCV 83 81 - 101 fL   MCH 26.6 26.0 - 34.0 pg   MCHC 32.1 32.0 - 36.0 g/dL   RDW 21.6 (H) 11.1 - 15.7 %   Platelets 244 145 - 400 10e3/uL   NEUT# 3.3 1.5 - 6.5 10e3/uL  LYMPH# 2.0 0.9 - 3.3 10e3/uL   MONO# 0.6 0.1 - 0.9 10e3/uL   Eosinophils Absolute 0.1 0.0 - 0.5 10e3/uL   BASO# 0.0 0.0 - 0.2 10e3/uL   NEUT% 54.3 39.6 - 80.0 %   LYMPH% 32.9 14.0 - 48.0 %   MONO% 10.1 0.0 - 13.0 %   EOS% 2.0 0.0 - 7.0 %   BASO% 0.7 0.0 - 2.0 %  Smear     Status: None   Collection Time: 05/02/17  1:46 PM  Result Value Ref Range   Smear Result Smear Available       RADIOGRAPHY: No results found.     PATHOLOGY: None  ASSESSMENT/PLAN: Ms. Kosar is a very pleasant 61 yo caucasian female with history of iron deficiency anemia and had a recent GI bleed due to NSAIDs and Cameron's lesions. She was hospitalized for several days receiving fluids and 4 units of blood. She is now home on Protonix BID and oral iron.  Hgb is stable at 11.0 and MCV of 83. We will see what her iron studies show and bring her back in next week for an infusion if needed. We discussed the pro and cons of IV iron. All questions were answered.  She has discontinues all NSAIDs and states that her symptoms have all resolved and that her back pain with arthritis seems much better.  We will schedule her follow-u ponce her lab work is back.  She will contact our office with any questions or concerns. We can certainly see her much sooner if needed.  She was discussed with and also seen by Dr. Marin Olp and he is in agreement with the aforementioned.   [facility]     Addendum:  I saw and examined the patient with Finnean Cerami. Her lab studies clearly show iron deficiency. Her ferritin is 13 with an iron saturation of 36%.  She has the GI bleeding from her ulcers. These are from her taking nonsteroidals.  We will go ahead  and get her set up with IV iron. I know that this will truly help her out. Her blood count really does not look all that bad right now.  We will probably will get her back in about 4 weeks or so.  I have to believe that the IV I'm will help her out quite a bit.  We spent about 40 minutes with her today.  Lattie Haw, MD

## 2017-05-02 NOTE — Telephone Encounter (Signed)
eScribe request from [facility] for refill on Livalo 4 mg tab Last filled - 11/01/16, #30x5 Last AEX - 04/18/17  Next AEX - 4-Mths. Refill sent per [facility] refill protocol/SLS

## 2017-05-03 LAB — RETICULOCYTES: RETICULOCYTE COUNT: 1.7 % (ref 0.6–2.6)

## 2017-05-05 LAB — IRON AND TIBC
%SAT: 36 % (ref 21–57)
IRON: 139 ug/dL (ref 41–142)
TIBC: 388 ug/dL (ref 236–444)
UIBC: 248 ug/dL (ref 120–384)

## 2017-05-05 LAB — FERRITIN: FERRITIN: 13 ng/mL (ref 9–269)

## 2017-05-06 ENCOUNTER — Encounter: Payer: Self-pay | Admitting: *Deleted

## 2017-05-06 ENCOUNTER — Other Ambulatory Visit: Payer: Self-pay | Admitting: Family

## 2017-05-06 DIAGNOSIS — D500 Iron deficiency anemia secondary to blood loss (chronic): Secondary | ICD-10-CM

## 2017-05-08 ENCOUNTER — Other Ambulatory Visit: Payer: Self-pay | Admitting: Family

## 2017-05-08 MED ORDER — ZOLPIDEM TARTRATE 10 MG PO TABS: 10.0000 mg | ORAL_TABLET | Freq: Every evening | ORAL | 0 refills | Status: DC | PRN

## 2017-05-08 NOTE — Telephone Encounter (Signed)
Rx faxed to pharmacy  

## 2017-05-08 NOTE — Telephone Encounter (Signed)
Last RX: 04/07/17 Last OV: 04/14/17 Next OV: 07/14/17 UDS: 04/07/17 and due 07/07/17  Rx printed and forwarded to PCP for signature.

## 2017-06-05 ENCOUNTER — Ambulatory Visit: Payer: Managed Care, Other (non HMO) | Admitting: Family

## 2017-06-05 ENCOUNTER — Other Ambulatory Visit: Payer: Managed Care, Other (non HMO)

## 2017-06-09 ENCOUNTER — Encounter: Payer: Self-pay | Admitting: *Deleted

## 2017-06-09 ENCOUNTER — Other Ambulatory Visit: Payer: Self-pay | Admitting: Family

## 2017-06-09 ENCOUNTER — Other Ambulatory Visit (HOSPITAL_BASED_OUTPATIENT_CLINIC_OR_DEPARTMENT_OTHER): Payer: Managed Care, Other (non HMO)

## 2017-06-09 ENCOUNTER — Ambulatory Visit (HOSPITAL_BASED_OUTPATIENT_CLINIC_OR_DEPARTMENT_OTHER): Payer: Managed Care, Other (non HMO) | Admitting: Family

## 2017-06-09 ENCOUNTER — Telehealth: Payer: Self-pay | Admitting: *Deleted

## 2017-06-09 VITALS — BP 118/75 | HR 65 | Temp 98.2°F | Resp 17 | Wt 200.0 lb

## 2017-06-09 DIAGNOSIS — K922 Gastrointestinal hemorrhage, unspecified: Secondary | ICD-10-CM

## 2017-06-09 DIAGNOSIS — D500 Iron deficiency anemia secondary to blood loss (chronic): Secondary | ICD-10-CM | POA: Diagnosis not present

## 2017-06-09 LAB — CBC WITH DIFFERENTIAL (CANCER CENTER ONLY)
BASO#: 0 10*3/uL (ref 0.0–0.2)
BASO%: 0.6 % (ref 0.0–2.0)
EOS%: 1.5 % (ref 0.0–7.0)
Eosinophils Absolute: 0.1 10*3/uL (ref 0.0–0.5)
HCT: 39 % (ref 34.8–46.6)
HGB: 12.7 g/dL (ref 11.6–15.9)
LYMPH#: 2.2 10*3/uL (ref 0.9–3.3)
LYMPH%: 42.3 % (ref 14.0–48.0)
MCH: 28.5 pg (ref 26.0–34.0)
MCHC: 32.6 g/dL (ref 32.0–36.0)
MCV: 87 fL (ref 81–101)
MONO#: 0.5 10*3/uL (ref 0.1–0.9)
MONO%: 8.8 % (ref 0.0–13.0)
NEUT#: 2.5 10*3/uL (ref 1.5–6.5)
NEUT%: 46.8 % (ref 39.6–80.0)
PLATELETS: 218 10*3/uL (ref 145–400)
RBC: 4.46 10*6/uL (ref 3.70–5.32)
RDW: 20.4 % — AB (ref 11.1–15.7)
WBC: 5.3 10*3/uL (ref 3.9–10.0)

## 2017-06-09 LAB — IRON AND TIBC
%SAT: 34 % (ref 21–57)
Iron: 123 ug/dL (ref 41–142)
TIBC: 365 ug/dL (ref 236–444)
UIBC: 242 ug/dL (ref 120–384)

## 2017-06-09 LAB — FERRITIN: FERRITIN: 15 ng/mL (ref 9–269)

## 2017-06-09 MED ORDER — ZOLPIDEM TARTRATE 10 MG PO TABS: 10.0000 mg | ORAL_TABLET | Freq: Every evening | ORAL | 0 refills | Status: DC | PRN

## 2017-06-09 NOTE — Progress Notes (Addendum)
Hematology and Oncology Follow Up Visit  Dawn Huber [id number] November 11, 1956 61 y.o. 06/09/2017   Principle Diagnosis:  Iron deficiency anemia   Current Therapy:   OTC oral iron supplement 325 mg PO daily    Interim History:  Dawn Huber is here today for follow-up. She is doing well and has no complaints at this time. She is taking her iron supplement daily and her counts continue to improve.  Hgb is up to 12.7 and MCV is 87. Iron studies are pending.  No episodes of bleeding, bruising or petechiae. No lymphadenopathy found on exam.  No fever, chills, n/v, cough, rash, dizziness, SOB, chest pain, palpitations, abdominal pain or changes in bowel or bladder habits.  No swelling, tenderness, numbness or tingling in her extremities at this time. She has arthritic discomfort in her back that comes and goes. This is unchanged.  She has maintained a good appetite and is staying well hydrated. Her weight is stable.   ECOG Performance Status: 0 - Asymptomatic  Medications:  Allergies as of 06/09/2017      Reactions   Amoxicillin Other (See Comments)   Itching of hands and feet      Medication List       Accurate as of 06/09/17 10:00 AM. Always use your most recent med list.          calcium carbonate 600 MG Tabs tablet Commonly known as:  OS-CAL Take 600 mg by mouth 2 (two) times daily with a meal. Pt taking one tablet a day   ferrous sulfate 325 (65 FE) MG tablet Take 325 mg by mouth daily with breakfast.   Fish Oil 1200 MG Caps Take 2 capsules by mouth daily.   FLAX SEED OIL PO Take 1 capsule by mouth daily.   hydrocortisone-pramoxine rectal foam Commonly known as:  PROCTOFOAM HC Place 1 applicator rectally 2 (two) times daily.   LIVALO 4 MG Tabs Generic drug:  Pitavastatin Calcium TAKE ONE TABLET BY MOUTH ONCE DAILY   pantoprazole 40 MG tablet Commonly known as:  PROTONIX Take 40 mg by mouth daily.   vitamin B-12 1000 MCG tablet Commonly known as:   CYANOCOBALAMIN Take 1,000 mcg by mouth daily. chewable   Vitamin D3 2000 units Tabs Take 1 tablet by mouth daily. Pt's tablets are 1000mg  each.   zolpidem 10 MG tablet Commonly known as:  AMBIEN Take 1 tablet (10 mg total) by mouth at bedtime as needed for sleep.       Allergies:  Allergies  Allergen Reactions  . Amoxicillin Other (See Comments)    Itching of hands and feet    Past Medical History, Surgical history, Social history, and Family History were reviewed and updated.  Review of Systems: All other 10 point review of systems is negative.   Physical Exam:  weight is 200 lb (90.7 kg). Her oral temperature is 98.2 F (36.8 C). Her blood pressure is 118/75 and her pulse is 65. Her respiration is 17 and oxygen saturation is 98%.   Wt Readings from Last 3 Encounters:  06/09/17 200 lb (90.7 kg)  05/02/17 198 lb 12.8 oz (90.2 kg)  04/18/17 199 lb 3.2 oz (90.4 kg)    Ocular: Sclerae unicteric, pupils equal, round and reactive to light Ear-nose-throat: Oropharynx clear, dentition fair Lymphatic: No cervical, supraclavicular or axillary adenopathy Lungs no rales or rhonchi, good excursion bilaterally Heart regular rate and rhythm, no murmur appreciated Abd soft, nontender, positive bowel sounds, no liver or spleen tip palpated on  exam, no fluid wave  MSK no focal spinal tenderness, no joint edema Neuro: non-focal, well-oriented, appropriate affect Breasts: Deferred   Lab Results  Component Value Date   WBC 5.3 06/09/2017   HGB 12.7 06/09/2017   HCT 39.0 06/09/2017   MCV 87 06/09/2017   PLT 218 06/09/2017   Lab Results  Component Value Date   FERRITIN 13 05/02/2017   IRON 139 05/02/2017   TIBC 388 05/02/2017   UIBC 248 05/02/2017   IRONPCTSAT 36 05/02/2017   Lab Results  Component Value Date   RBC 4.46 06/09/2017   No results found for: KPAFRELGTCHN, LAMBDASER, KAPLAMBRATIO No results found for: IGGSERUM, IGA, IGMSERUM No results found for: Odetta Pink, SPEI   Chemistry      Component Value Date/Time   NA 135 05/02/2017 1346   K 4.2 05/02/2017 1346   CL 103 05/02/2017 1346   CO2 27 05/02/2017 1346   BUN 11 05/02/2017 1346   CREATININE 0.69 05/02/2017 1346   CREATININE 0.70 04/18/2017 1521      Component Value Date/Time   CALCIUM 9.7 05/02/2017 1346   ALKPHOS 59 05/02/2017 1346   AST 16 05/02/2017 1346   ALT 11 05/02/2017 1346   BILITOT 0.3 05/02/2017 1346      Impression and Plan: Dawn Huber is a very pleasant 61 yo caucasian female with history of iron deficiency anemia secondary to GI bleed. She is doing well and has had a nice response to the oral iron supplement she has been taking. We will have her continue on this.  We will see what her iron studies show. Hgb is up to 12.7 with an MCV of 87. Hopefully she will not require IV iron.  We will plan to see her back in 3 months for repeat lab work and follow-up.  She will contact our office with any questions or concerns. We can certainly see her sooner if need be.   Eliezer Bottom, NP 6/18/201810:00 AM

## 2017-06-09 NOTE — Telephone Encounter (Addendum)
Patient is aware of results  ----- Message from Eliezer Bottom, NP sent at 06/09/2017  1:22 PM EDT ----- Regarding: Iron  Iron stable. No infusion needed. Stay on supplement! Thank you!  Sarah  ----- Message ----- From: Interface, Lab In Three Zero One Sent: 06/09/2017   9:16 AM To: Eliezer Bottom, NP

## 2017-06-09 NOTE — Telephone Encounter (Signed)
Rx faxed to pharmacy; patient informed via My Chart message/SLS 06/18

## 2017-06-09 NOTE — Telephone Encounter (Signed)
Refill request for Zolpidem 10 mg tab Last filled by MD on - 05/08/17, #30x0 Last AEX - 04/18/17 Next AEX - 3-Mths., pt has appointment scheduled for 07/14/17 Last UDS - 04/08/17, moderate [Zolpidem detected] Please Advise on refills/SLS 06/18

## 2017-06-10 LAB — RETICULOCYTES: Reticulocyte Count: 1.5 % (ref 0.6–2.6)

## 2017-07-11 ENCOUNTER — Other Ambulatory Visit: Payer: Self-pay | Admitting: Family

## 2017-07-11 MED ORDER — ZOLPIDEM TARTRATE 10 MG PO TABS: 10.0000 mg | ORAL_TABLET | Freq: Every evening | ORAL | 0 refills | Status: DC | PRN

## 2017-07-11 NOTE — Telephone Encounter (Signed)
Rx faxed to pharmacy at 12:18 pm.

## 2017-07-11 NOTE — Telephone Encounter (Signed)
Last zolpidem RX: 06/09/17, #30 Last OV: 04/18/17 Next OV: 07/14/17 UDS: 04/07/17 moderate and due 07/07/17. Pt has f/u with PCP on 07/14/17 and will collect UDS at that visit.  Rx printed and forwarded to PCP for signature.

## 2017-07-14 ENCOUNTER — Encounter: Payer: Self-pay | Admitting: Family

## 2017-07-14 ENCOUNTER — Ambulatory Visit (INDEPENDENT_AMBULATORY_CARE_PROVIDER_SITE_OTHER): Payer: Managed Care, Other (non HMO) | Admitting: Family

## 2017-07-14 VITALS — BP 125/73 | HR 66 | Temp 97.8°F | Ht 63.0 in | Wt 199.4 lb

## 2017-07-14 DIAGNOSIS — E559 Vitamin D deficiency, unspecified: Secondary | ICD-10-CM | POA: Diagnosis not present

## 2017-07-14 DIAGNOSIS — G4700 Insomnia, unspecified: Secondary | ICD-10-CM | POA: Diagnosis not present

## 2017-07-14 DIAGNOSIS — K219 Gastro-esophageal reflux disease without esophagitis: Secondary | ICD-10-CM | POA: Diagnosis not present

## 2017-07-14 DIAGNOSIS — E785 Hyperlipidemia, unspecified: Secondary | ICD-10-CM | POA: Diagnosis not present

## 2017-07-14 MED ORDER — PANTOPRAZOLE SODIUM 40 MG PO TBEC: 40.0000 mg | DELAYED_RELEASE_TABLET | Freq: Every day | ORAL | 5 refills | Status: DC

## 2017-07-14 MED ORDER — PITAVASTATIN CALCIUM 4 MG PO TABS: 1.0000 | ORAL_TABLET | Freq: Every day | ORAL | 3 refills | Status: DC

## 2017-07-14 NOTE — Assessment & Plan Note (Signed)
Tolerating livalo- at goal.  Continue same. Plan to repeat lipids in October at her physical.

## 2017-07-14 NOTE — Assessment & Plan Note (Signed)
Stable on ambien, continue same.

## 2017-07-14 NOTE — Patient Instructions (Signed)
Please decrease protonix to 40mg  once daily. Keep your upcoming appointment with GI. Follow up in October for your physical as scheduled.

## 2017-07-14 NOTE — Assessment & Plan Note (Signed)
Stable, advised pt OK to decrease protonix to once daily. Keep follow up with GI in August as scheduled.

## 2017-07-14 NOTE — Progress Notes (Signed)
Subjective:    Patient ID: Dawn Huber, female    DOB: 05-Oct-1956, 61 y.o.   MRN: [id number]  HPI  Dawn Huber is a 61 yr old female who presents today for follow up.  1) Hyperlipidemia- pt is maintained on livalo 4mg .  Denies myalgia.  Lab Results  Component Value Date   CHOL 154 10/07/2016   HDL 58.90 10/07/2016   LDLCALC 71 10/07/2016   LDLDIRECT 172.0 07/10/2015   TRIG 120.0 10/07/2016   CHOLHDL 3 10/07/2016   2) Vit D deficiency- maintained on vit D 2000 iu once daily.  He also continue a calcium supplement.   3) Insomnia- continues ambien on an as needed basis. Reports that she takes it most nights.  It helps her to sleep.    4) GERD/hx of GI bleed. maintained on protonix.  Taking bid, has GI follow up in August. Denies abdominal pain. Has discontinued nsaids.   Review of Systems See HPI  Past Medical History:  Diagnosis Date  . Diverticulosis   . Genital warts   . GI bleed due to NSAIDs 03/2017   hospitalized at [facility]  . History of chicken pox   . Hypertension   . Osteopenia 02/18/2012  . Personal history of colonic polyps      Social History   Social History  . Marital status: Married    Spouse name: N/A  . Number of children: 2  . Years of education: N/A   Occupational History  . Not on file.   Social History Main Topics  . Smoking status: Former Smoker    Quit date: 08/16/2013  . Smokeless tobacco: Never Used     Comment: Pt states she "vapes".  . Alcohol use No  . Drug use: No  . Sexual activity: Not on file   Other Topics Concern  . Not on file   Social History Narrative   Regular exercise:  No, walks dog daily   Caffeine Use:  12oz pepsi daily   Works as a Haematologist.   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   Married              Past Surgical History:  Procedure Laterality Date  . ABDOMINAL HYSTERECTOMY  1999   partial  . TUBAL LIGATION      Family History  Problem Relation Age of  Onset  . Cancer Mother        lung  . Emphysema Father   . Stroke Father   . Seizures Father   . Heart disease Father   . Hypertension Father   . Colon cancer Maternal Aunt 65    Allergies  Allergen Reactions  . Amoxicillin Other (See Comments)    Itching of hands and feet    Current Outpatient Prescriptions on File Prior to Visit  Medication Sig Dispense Refill  . calcium carbonate (OS-CAL) 600 MG TABS tablet Take 600 mg by mouth 2 (two) times daily with a meal. Pt taking one tablet a day    . Cholecalciferol (VITAMIN D3) 2000 UNITS TABS Take 1 tablet by mouth daily. Pt's tablets are 1000mg  each.    . ferrous sulfate 325 (65 FE) MG tablet Take 325 mg by mouth daily with breakfast.    . Flaxseed, Linseed, (FLAX SEED OIL PO) Take 1 capsule by mouth daily.    . hydrocortisone-pramoxine (PROCTOFOAM HC) rectal foam Place 1 applicator rectally 2 (two) times daily. 10 g 2  . LIVALO  4 MG TABS TAKE ONE TABLET BY MOUTH ONCE DAILY 90 tablet 0  . Omega-3 Fatty Acids (FISH OIL) 1200 MG CAPS Take 2 capsules by mouth daily.    . pantoprazole (PROTONIX) 40 MG tablet Take 40 mg by mouth daily.    . vitamin B-12 (CYANOCOBALAMIN) 1000 MCG tablet Take 1,000 mcg by mouth daily. chewable    . zolpidem (AMBIEN) 10 MG tablet Take 1 tablet (10 mg total) by mouth at bedtime as needed for sleep. 30 tablet 0   No current facility-administered medications on file prior to visit.     BP 125/73 (BP Location: Left Arm, Patient Position: Sitting, Cuff Size: Normal)   Pulse 66   Temp 97.8 F (36.6 C) (Oral)   Ht 5\' 3"  (1.6 m)   Wt 199 lb 6.4 oz (90.4 kg)   SpO2 98%   BMI 35.32 kg/m       Objective:   Physical Exam  Constitutional: She is oriented to person, place, and time. She appears well-developed and well-nourished.  HENT:  Head: Normocephalic and atraumatic.  Cardiovascular: Normal rate, regular rhythm and normal heart sounds.   No murmur heard. Pulmonary/Chest: Effort normal and breath  sounds normal. No respiratory distress. She has no wheezes.  Musculoskeletal: She exhibits no edema.  Neurological: She is alert and oriented to person, place, and time.  Psychiatric: She has a normal mood and affect. Her behavior is normal. Judgment and thought content normal.          Assessment & Plan:

## 2017-07-14 NOTE — Assessment & Plan Note (Signed)
Clinically stable, continue Vit D supplement.

## 2017-07-22 ENCOUNTER — Telehealth: Payer: Self-pay | Admitting: Family

## 2017-07-22 NOTE — Telephone Encounter (Signed)
Spoke with pt. She is having "bladder pressure" and urinary urgency. Advised pt she will need office visit for evaluation of her symptoms and there are no openings in our office this afternoon. Offered appt at satellite office but pt declines and requests appt for tomorrow. Scheduled appt at 8am with Dr Nani Ravens and advised pt if symptoms worsen to go to urgent care or ER if after hours. Pt voices understanding.

## 2017-07-22 NOTE — Telephone Encounter (Signed)
Caller name:Crystale Merrilyn Puma  Relationship to patient: Can be [place]ros:  Reason for call:Patient is requesting UA, was seen last week and is having problems again

## 2017-07-23 ENCOUNTER — Encounter: Payer: Self-pay | Admitting: Family Medicine

## 2017-07-23 ENCOUNTER — Ambulatory Visit (INDEPENDENT_AMBULATORY_CARE_PROVIDER_SITE_OTHER): Payer: Managed Care, Other (non HMO) | Admitting: Family Medicine

## 2017-07-23 VITALS — BP 110/70 | HR 73 | Temp 98.1°F | Ht 63.0 in | Wt 200.0 lb

## 2017-07-23 DIAGNOSIS — N3000 Acute cystitis without hematuria: Secondary | ICD-10-CM | POA: Diagnosis not present

## 2017-07-23 LAB — POC URINALSYSI DIPSTICK (AUTOMATED)
Bilirubin, UA: NEGATIVE
Blood, UA: NEGATIVE
GLUCOSE UA: NEGATIVE
Ketones, UA: NEGATIVE
Nitrite, UA: NEGATIVE
Protein, UA: NEGATIVE
SPEC GRAV UA: 1.015 (ref 1.010–1.025)
UROBILINOGEN UA: 0.2 U/dL
pH, UA: 6 (ref 5.0–8.0)

## 2017-07-23 MED ORDER — NITROFURANTOIN MONOHYD MACRO 100 MG PO CAPS: 100.0000 mg | ORAL_CAPSULE | Freq: Two times a day (BID) | ORAL | 0 refills | Status: AC

## 2017-07-23 MED ORDER — SULFAMETHOXAZOLE-TRIMETHOPRIM 800-160 MG PO TABS: 1.0000 | ORAL_TABLET | Freq: Two times a day (BID) | ORAL | 0 refills | Status: DC

## 2017-07-23 NOTE — Patient Instructions (Addendum)
I will let you know via MyChart whether to stop or keep taking the antibiotic. Keep an eye on out Friday.  If symptoms worsen, return to clinic.   Let us know if you need anything.

## 2017-07-23 NOTE — Progress Notes (Signed)
Chief Complaint  Patient presents with  . Urinary Tract Infection    back pain and side (R),nausea,urgency,freq,slight pain    Dawn Huber is a 61 y.o. female here for possible UTI.  Duration: 3 days. Symptoms: urinary frequency, flank pain, sweats, nausea and dysuria Denies: hematuria, urinary retention, fever, chills, vomiting, constipation, vaginal discharge She has had urinary tract infections in the past and states this is the typical presentation, including the nausea and back pain. No history of pyelonephritis. Hx of recurrent UTI? No Denies new sexual partners.  ROS:  Constitutional: denies fever GU: As noted in HPI Abd: Denies constipation  Past Medical History:  Diagnosis Date  . Diverticulosis   . Genital warts   . GI bleed due to NSAIDs 03/2017   hospitalized at [facility]  . History of chicken pox   . Hypertension   . Osteopenia 02/18/2012  . Personal history of colonic polyps    Family History  Problem Relation Age of Onset  . Cancer Mother        lung  . Emphysema Father   . Stroke Father   . Seizures Father   . Heart disease Father   . Hypertension Father   . Colon cancer Maternal Aunt 65   Social History   Social History  . Marital status: Married  . Number of children: 2   Social History Main Topics  . Smoking status: Former Smoker    Quit date: 08/16/2013  . Smokeless tobacco: Never Used     Comment: Pt states she "vapes".  . Alcohol use No   Social History Narrative   Regular exercise:  No, walks dog daily   Caffeine Use:  12oz pepsi daily   Works as a Haematologist.   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   Married    BP 110/70 (BP Location: Left Arm, Patient Position: Sitting, Cuff Size: Normal)   Pulse 73   Temp 98.1 F (36.7 C) (Oral)   Ht 5\' 3"  (1.6 m)   Wt 200 lb (90.7 kg)   SpO2 97%   BMI 35.43 kg/m  General: Awake, alert, appears stated age 8: MMM Heart: RRR, no murmurs Lungs:  CTAB, normal respiratory effort, no accessory muscle usage Abd: BS+, soft, TTP in lower quadrants, ND, no masses or organomegaly MSK: +mild CVA tenderness, neg Lloyd's sign Psych: Age appropriate judgment and insight  Acute cystitis without hematuria - Plan: nitrofurantoin, macrocrystal-monohydrate, (MACROBID) 100 MG capsule, DISCONTINUED: sulfamethoxazole-trimethoprim (BACTRIM DS) 800-160 MG tablet  Orders as above. +LE in urine, will tx given this and symptoms, culture for sensitivity. Will notify via MyChart if she should stay on abx or stop. Stay well hydrated. OK to use cranberry products if it makes her feel better.   F/u prn, return if new or worsening symptoms. The patient voiced understanding and agreement to the plan.  [place], DO 07/23/17 8:36 AM

## 2017-07-23 NOTE — Addendum Note (Signed)
Addended by: Harl Bowie on: 07/23/2017 08:49 AM   Modules accepted: Orders

## 2017-07-24 LAB — URINE CULTURE

## 2017-08-13 ENCOUNTER — Other Ambulatory Visit: Payer: Self-pay | Admitting: Family

## 2017-08-13 MED ORDER — ZOLPIDEM TARTRATE 10 MG PO TABS: 10.0000 mg | ORAL_TABLET | Freq: Every evening | ORAL | 0 refills | Status: DC | PRN

## 2017-08-13 NOTE — Telephone Encounter (Signed)
Last zolpidem RX: 07/11/17, #30 Last OV: 07/14/17 Next OV: 10/13/17 UDS: 04/07/17 moderate and was due 06/2017. Will have pt pick up Rx and provide urine at that time.  Rx printed and forwarded to cover Provider ([place]) for signature.

## 2017-08-20 NOTE — Telephone Encounter (Signed)
Rx faxed to pharmacy last Wednesday.

## 2017-09-08 ENCOUNTER — Ambulatory Visit (HOSPITAL_BASED_OUTPATIENT_CLINIC_OR_DEPARTMENT_OTHER): Payer: Managed Care, Other (non HMO) | Admitting: Family

## 2017-09-08 ENCOUNTER — Other Ambulatory Visit (HOSPITAL_BASED_OUTPATIENT_CLINIC_OR_DEPARTMENT_OTHER): Payer: Managed Care, Other (non HMO)

## 2017-09-08 VITALS — BP 129/71 | HR 72 | Temp 98.6°F | Resp 16 | Wt 200.0 lb

## 2017-09-08 DIAGNOSIS — D500 Iron deficiency anemia secondary to blood loss (chronic): Secondary | ICD-10-CM | POA: Diagnosis not present

## 2017-09-08 DIAGNOSIS — K922 Gastrointestinal hemorrhage, unspecified: Secondary | ICD-10-CM

## 2017-09-08 LAB — CBC WITH DIFFERENTIAL (CANCER CENTER ONLY)
BASO#: 0 10*3/uL (ref 0.0–0.2)
BASO%: 0.5 % (ref 0.0–2.0)
EOS%: 2.3 % (ref 0.0–7.0)
Eosinophils Absolute: 0.1 10*3/uL (ref 0.0–0.5)
HEMATOCRIT: 41.1 % (ref 34.8–46.6)
HEMOGLOBIN: 13.8 g/dL (ref 11.6–15.9)
LYMPH#: 2.4 10*3/uL (ref 0.9–3.3)
LYMPH%: 41.2 % (ref 14.0–48.0)
MCH: 31.7 pg (ref 26.0–34.0)
MCHC: 33.6 g/dL (ref 32.0–36.0)
MCV: 94 fL (ref 81–101)
MONO#: 0.5 10*3/uL (ref 0.1–0.9)
MONO%: 9 % (ref 0.0–13.0)
NEUT%: 47 % (ref 39.6–80.0)
NEUTROS ABS: 2.7 10*3/uL (ref 1.5–6.5)
Platelets: 204 10*3/uL (ref 145–400)
RBC: 4.36 10*6/uL (ref 3.70–5.32)
RDW: 13.7 % (ref 11.1–15.7)
WBC: 5.8 10*3/uL (ref 3.9–10.0)

## 2017-09-08 LAB — IRON AND TIBC
%SAT: 30 % (ref 21–57)
IRON: 100 ug/dL (ref 41–142)
TIBC: 329 ug/dL (ref 236–444)
UIBC: 229 ug/dL (ref 120–384)

## 2017-09-08 LAB — FERRITIN: Ferritin: 30 ng/ml (ref 9–269)

## 2017-09-08 NOTE — Progress Notes (Signed)
Hematology and Oncology Follow Up Visit  Dawn Huber [id number] 02/04/1956 61 y.o. 09/08/2017   Principle Diagnosis:  Iron deficiency anemia   Current Therapy:   OTC oral iron supplement 325 mg PO daily    Interim History:  Dawn Huber is here today for follow-up. She is feeling a little fatigued today. She has been working more than usual and has not been sleeping well.  She states that she will occasionally have a few palpitations with stress.  Her Hgb is now up to 13.8 with an MCV of 94. She states that she is taking her oral iron supplement daily as prescribed and is now down to 40 mg of Protonix PO daily.  No fever, chills, n/v, cough, rash, dizziness, SOB, chest pain, abdominal pain or changes in bowel or bladder habits.  She has puffiness in the left foot that comes and goes. This improves with elevating her feet. She wears knee high compression stockings daily.  She has some burning in the bottoms of her feet due to chronic lower back issues.  She has maintained a good appetite and is staying well hydrated. Her weight is stable.   ECOG Performance Status: 0 - Asymptomatic  Medications:  Allergies as of 09/08/2017      Reactions   Amoxicillin Other (See Comments)   Itching of hands and feet      Medication List       Accurate as of 09/08/17  8:38 AM. Always use your most recent med list.          calcium carbonate 600 MG Tabs tablet Commonly known as:  OS-CAL Take 600 mg by mouth 2 (two) times daily with a meal. Pt taking one tablet a day   ferrous sulfate 325 (65 FE) MG tablet Take 325 mg by mouth daily with breakfast.   Fish Oil 1200 MG Caps Take 2 capsules by mouth daily.   FLAX SEED OIL PO Take 1 capsule by mouth daily.   hydrocortisone-pramoxine rectal foam Commonly known as:  PROCTOFOAM HC Place 1 applicator rectally 2 (two) times daily.   pantoprazole 40 MG tablet Commonly known as:  PROTONIX Take 1 tablet (40 mg total) by mouth daily.     Pitavastatin Calcium 4 MG Tabs Commonly known as:  LIVALO Take 1 tablet (4 mg total) by mouth daily.   vitamin B-12 1000 MCG tablet Commonly known as:  CYANOCOBALAMIN Take 1,000 mcg by mouth daily. chewable   Vitamin D3 2000 units Tabs Take 1 tablet by mouth daily. Pt's tablets are 1000mg  each.   zolpidem 10 MG tablet Commonly known as:  AMBIEN Take 1 tablet (10 mg total) by mouth at bedtime as needed for sleep.       Allergies:  Allergies  Allergen Reactions  . Amoxicillin Other (See Comments)    Itching of hands and feet    Past Medical History, Surgical history, Social history, and Family History were reviewed and updated.  Review of Systems: All other 10 point review of systems is negative.   Physical Exam:  vitals were not taken for this visit.  Wt Readings from Last 3 Encounters:  07/23/17 200 lb (90.7 kg)  07/14/17 199 lb 6.4 oz (90.4 kg)  06/09/17 200 lb (90.7 kg)    Ocular: Sclerae unicteric, pupils equal, round and reactive to light Ear-nose-throat: Oropharynx clear, dentition fair Lymphatic: No cervical, supraclavicular or axillary adenopathy Lungs no rales or rhonchi, good excursion bilaterally Heart regular rate and rhythm, no murmur appreciated Abd  soft, nontender, positive bowel sounds, no liver or spleen tip palpated on exam, no fluid wave  MSK no focal spinal tenderness, no joint edema Neuro: non-focal, well-oriented, appropriate affect Breasts: Deferred   Lab Results  Component Value Date   WBC 5.8 09/08/2017   HGB 13.8 09/08/2017   HCT 41.1 09/08/2017   MCV 94 09/08/2017   PLT 204 09/08/2017   Lab Results  Component Value Date   FERRITIN 15 06/09/2017   IRON 123 06/09/2017   TIBC 365 06/09/2017   UIBC 242 06/09/2017   IRONPCTSAT 34 06/09/2017   Lab Results  Component Value Date   RBC 4.36 09/08/2017   No results found for: KPAFRELGTCHN, LAMBDASER, KAPLAMBRATIO No results found for: IGGSERUM, IGA, IGMSERUM No results found  for: Odetta Pink, SPEI   Chemistry      Component Value Date/Time   NA 135 05/02/2017 1346   K 4.2 05/02/2017 1346   CL 103 05/02/2017 1346   CO2 27 05/02/2017 1346   BUN 11 05/02/2017 1346   CREATININE 0.69 05/02/2017 1346   CREATININE 0.70 04/18/2017 1521      Component Value Date/Time   CALCIUM 9.7 05/02/2017 1346   ALKPHOS 59 05/02/2017 1346   AST 16 05/02/2017 1346   ALT 11 05/02/2017 1346   BILITOT 0.3 05/02/2017 1346      Impression and Plan: Dawn Huber is a very pleasant 61 yo caucasian female with history of iron deficiency anemia secondary to GI bleed. She has responded nicely to the oral iron supplement and has not required IV iron.   Hgb is stable at 10.8 with an MCV of 94. We will see what her iron studies from today show.  She will continue on her same daily iron supplement regimen for now.  We will go ahead and plan to see her back again in another 4 months for repeat lab work and follow-up.  She will contact our office with any questions or concerns. We can certinly see her sooner if need be.   Eliezer Bottom, NP 9/17/20188:38 AM

## 2017-09-09 LAB — RETICULOCYTES: Reticulocyte Count: 2 % (ref 0.6–2.6)

## 2017-10-13 ENCOUNTER — Encounter: Payer: Self-pay | Admitting: Family

## 2017-10-13 ENCOUNTER — Ambulatory Visit (INDEPENDENT_AMBULATORY_CARE_PROVIDER_SITE_OTHER): Payer: Managed Care, Other (non HMO) | Admitting: Family

## 2017-10-13 VITALS — BP 125/74 | HR 66 | Temp 98.3°F | Resp 16 | Ht 63.0 in | Wt 205.6 lb

## 2017-10-13 DIAGNOSIS — Z0000 Encounter for general adult medical examination without abnormal findings: Secondary | ICD-10-CM

## 2017-10-13 LAB — LIPID PANEL
CHOL/HDL RATIO: 3
Cholesterol: 175 mg/dL (ref 0–200)
HDL: 56.9 mg/dL (ref >39.00)
LDL Cholesterol: 84 mg/dL (ref 0–99)
NONHDL: 118.17
Triglycerides: 173 mg/dL — ABNORMAL HIGH (ref 0.0–149.0)
VLDL: 34.6 mg/dL (ref 0.0–40.0)

## 2017-10-13 LAB — URINALYSIS, ROUTINE W REFLEX MICROSCOPIC
BILIRUBIN URINE: NEGATIVE
KETONES UR: NEGATIVE
NITRITE: NEGATIVE
Specific Gravity, Urine: 1.01 (ref 1.000–1.030)
Total Protein, Urine: NEGATIVE
Urine Glucose: NEGATIVE
Urobilinogen, UA: 0.2 (ref 0.0–1.0)
pH: 6 (ref 5.0–8.0)

## 2017-10-13 LAB — BASIC METABOLIC PANEL WITH GFR
BUN: 11 mg/dL (ref 6–23)
CO2: 30 meq/L (ref 19–32)
Calcium: 9.5 mg/dL (ref 8.4–10.5)
Chloride: 105 meq/L (ref 96–112)
Creatinine, Ser: 0.61 mg/dL (ref 0.40–1.20)
GFR: 106.35 mL/min
Glucose, Bld: 96 mg/dL (ref 70–99)
Potassium: 4.6 meq/L (ref 3.5–5.1)
Sodium: 142 meq/L (ref 135–145)

## 2017-10-13 LAB — HEPATIC FUNCTION PANEL
ALT: 12 U/L (ref 0–35)
AST: 16 U/L (ref 0–37)
Albumin: 4.2 g/dL (ref 3.5–5.2)
Alkaline Phosphatase: 51 U/L (ref 39–117)
Bilirubin, Direct: 0 mg/dL (ref 0.0–0.3)
Total Bilirubin: 0.4 mg/dL (ref 0.2–1.2)
Total Protein: 6.8 g/dL (ref 6.0–8.3)

## 2017-10-13 LAB — TSH: TSH: 1.25 u[IU]/mL (ref 0.35–4.50)

## 2017-10-13 MED ORDER — ZOSTER VAC RECOMB ADJUVANTED 50 MCG/0.5ML IM SUSR: INTRAMUSCULAR | 1 refills | Status: DC

## 2017-10-13 NOTE — Progress Notes (Signed)
Subjective:    Patient ID: Dawn Huber, female    DOB: May 18, 1956, 61 y.o.   MRN: [id number]  HPI  Patient presents today for complete physical.  Immunizations: tetanus/flu up to date.   Diet:  Diet is fair, has been craving sweets lately Exercise:  Active job Colonoscopy:  9/16- 5 year follow up Dexa:  2013 Pap Smear:  hysterectomy Mammogram:  10/15 Dental: up to date Vision: up to date Wt Readings from Last 3 Encounters:  10/13/17 205 lb 9.6 oz (93.3 kg)  09/08/17 200 lb (90.7 kg)  07/23/17 200 lb (90.7 kg)       Review of Systems  Constitutional: Positive for unexpected weight change.  HENT: Negative for hearing loss and rhinorrhea.   Eyes: Negative for visual disturbance.  Respiratory: Negative for cough.   Cardiovascular: Negative for leg swelling.  Gastrointestinal: Negative for blood in stool, constipation and diarrhea.  Genitourinary: Negative for dysuria, frequency and hematuria.  Musculoskeletal: Positive for back pain. Negative for arthralgias and myalgias.  Skin: Negative for rash.  Neurological: Negative for headaches.  Hematological: Negative for adenopathy.  Psychiatric/Behavioral:       Denies depression or anxiety   Past Medical History:  Diagnosis Date  . Diverticulosis   . Genital warts   . GI bleed due to NSAIDs 03/2017   hospitalized at [facility]  . History of chicken pox   . Hypertension   . Osteopenia 02/18/2012  . Personal history of colonic polyps      Social History   Social History  . Marital status: Married    Spouse name: N/A  . Number of children: 2  . Years of education: N/A   Occupational History  . Not on file.   Social History Main Topics  . Smoking status: Former Smoker    Quit date: 08/16/2013  . Smokeless tobacco: Never Used     Comment: Pt states she "vapes".  . Alcohol use No  . Drug use: No  . Sexual activity: No   Other Topics Concern  . Not on file   Social History Narrative   Regular  exercise:  No, walks dog daily   Caffeine Use:  12oz pepsi daily   Works as a Haematologist.   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   Married              Past Surgical History:  Procedure Laterality Date  . ABDOMINAL HYSTERECTOMY  1999   partial  . TUBAL LIGATION      Family History  Problem Relation Age of Onset  . Cancer Mother        lung  . Emphysema Father   . Stroke Father   . Seizures Father   . Heart disease Father   . Hypertension Father   . Colon cancer Maternal Aunt 48  . Hypertension Brother   . Gout Son   . Osteoarthritis Daughter   . Varicose Veins Daughter     Allergies  Allergen Reactions  . Amoxicillin Other (See Comments)    Itching of hands and feet    Current Outpatient Prescriptions on File Prior to Visit  Medication Sig Dispense Refill  . calcium carbonate (OS-CAL) 600 MG TABS tablet Take 600 mg by mouth 2 (two) times daily with a meal. Pt taking one tablet a day    . Cholecalciferol (VITAMIN D3) 2000 UNITS TABS Take 1 tablet by mouth daily. Pt's tablets are 1000mg  each.    Marland Kitchen  ferrous sulfate 325 (65 FE) MG tablet Take 325 mg by mouth daily with breakfast.    . Flaxseed, Linseed, (FLAX SEED OIL PO) Take 1 capsule by mouth daily.    . hydrocortisone-pramoxine (PROCTOFOAM HC) rectal foam Place 1 applicator rectally 2 (two) times daily. 10 g 2  . pantoprazole (PROTONIX) 40 MG tablet Take 1 tablet (40 mg total) by mouth daily. 30 tablet 5  . Pitavastatin Calcium (LIVALO) 4 MG TABS Take 1 tablet (4 mg total) by mouth daily. 90 tablet 3  . vitamin B-12 (CYANOCOBALAMIN) 1000 MCG tablet Take 1,000 mcg by mouth daily. chewable     No current facility-administered medications on file prior to visit.     BP 125/74 (BP Location: Right Arm, Cuff Size: Large)   Pulse 66   Temp 98.3 F (36.8 C) (Oral)   Resp 16   Ht 5\' 3"  (1.6 m)   Wt 205 lb 9.6 oz (93.3 kg)   SpO2 99%   BMI 36.42 kg/m        Objective:   Physical  Exam  Physical Exam  Constitutional: She is oriented to person, place, and time. She appears well-developed and well-nourished. No distress.  HENT:  Head: Normocephalic and atraumatic.  Right Ear: Tympanic membrane and ear canal normal.  Left Ear: Tympanic membrane and ear canal normal.  Mouth/Throat: Oropharynx is clear and moist.  Eyes: Pupils are equal, round, and reactive to light. No scleral icterus.  Neck: Normal range of motion. No thyromegaly present.  Cardiovascular: Normal rate and regular rhythm.   No murmur heard. Pulmonary/Chest: Effort normal and breath sounds normal. No respiratory distress. He has no wheezes. She has no rales. She exhibits no tenderness.  Abdominal: Soft. Bowel sounds are normal. She exhibits no distension and no mass. There is no tenderness. There is no rebound and no guarding.  Musculoskeletal: She exhibits no edema.  Lymphadenopathy:    She has no cervical adenopathy.  Neurological: She is alert and oriented to person, place, and time. She has normal patellar reflexes. She exhibits normal muscle tone. Coordination normal.  Skin: Skin is warm and dry.  Psychiatric: She has a normal mood and affect. Her behavior is normal. Judgment and thought content normal.  Breasts: Examined lying Right: Without masses, retractions, discharge or axillary adenopathy.  Left: Without masses, retractions, discharge or axillary adenopathy.  Pelvic: deferred        Assessment & Plan:        Assessment & Plan:  Preventative care- discussed healthy diet, exercise, weight loss. EKG tracing is personally reviewed.  EKG notes NSR.  No acute changes. rx sent for shingles vaccine to her pharmacy.  Refer for mammogram.

## 2017-10-13 NOTE — Patient Instructions (Addendum)
Please complete lab work prior to leaving. Work on adding regular exercise such as walking 30 minutes 5 days a week.  Check with your insurance to see if Mammogram is covered at 100% at either [facility], [place] or [facility].

## 2017-10-14 ENCOUNTER — Telehealth: Payer: Self-pay | Admitting: Family

## 2017-10-14 ENCOUNTER — Encounter: Payer: Self-pay | Admitting: Family

## 2017-10-14 MED ORDER — CIPROFLOXACIN HCL 500 MG PO TABS: 500.0000 mg | ORAL_TABLET | Freq: Two times a day (BID) | ORAL | 0 refills | Status: DC

## 2017-10-14 NOTE — Telephone Encounter (Signed)
Notified pt and she voices understanding. 

## 2017-10-14 NOTE — Telephone Encounter (Signed)
Left message for pt to return my call.

## 2017-10-14 NOTE — Telephone Encounter (Signed)
triglycerides are mildly elevated. Please work on avoiding concentrated sweets, and limiting white carbs (rice/bread/pasta/potatoes). Instead substitute whole grain versions with reasonable portions.  Also UA shows probable UTI, rx sent for cipro.  Other lab work looks good.

## 2017-10-15 NOTE — Telephone Encounter (Signed)
Labs were released and pt was notified of need for above medication on 10/14/17. See phone note.

## 2017-10-20 ENCOUNTER — Other Ambulatory Visit: Payer: Self-pay | Admitting: Family

## 2017-10-20 DIAGNOSIS — Z1231 Encounter for screening mammogram for malignant neoplasm of breast: Secondary | ICD-10-CM

## 2017-11-10 ENCOUNTER — Ambulatory Visit
Admission: RE | Admit: 2017-11-10 | Discharge: 2017-11-10 | Disposition: A | Discharge status: Home / Self Care | Payer: Managed Care, Other (non HMO) | Source: Ambulatory Visit | Attending: Family | Admitting: Family

## 2017-11-10 DIAGNOSIS — Z1231 Encounter for screening mammogram for malignant neoplasm of breast: Secondary | ICD-10-CM

## 2018-01-07 ENCOUNTER — Other Ambulatory Visit: Payer: Self-pay | Admitting: Family

## 2018-01-12 ENCOUNTER — Ambulatory Visit: Payer: Managed Care, Other (non HMO) | Admitting: Hematology & Oncology

## 2018-01-12 ENCOUNTER — Other Ambulatory Visit: Payer: Managed Care, Other (non HMO)

## 2018-01-23 ENCOUNTER — Inpatient Hospital Stay (HOSPITAL_BASED_OUTPATIENT_CLINIC_OR_DEPARTMENT_OTHER): Payer: Managed Care, Other (non HMO) | Admitting: Hematology & Oncology

## 2018-01-23 ENCOUNTER — Other Ambulatory Visit: Payer: Self-pay

## 2018-01-23 ENCOUNTER — Inpatient Hospital Stay: Payer: Managed Care, Other (non HMO) | Attending: Hematology & Oncology

## 2018-01-23 ENCOUNTER — Encounter: Payer: Self-pay | Admitting: Hematology & Oncology

## 2018-01-23 VITALS — BP 133/69 | HR 67 | Temp 98.1°F | Wt 196.8 lb

## 2018-01-23 DIAGNOSIS — K922 Gastrointestinal hemorrhage, unspecified: Secondary | ICD-10-CM

## 2018-01-23 DIAGNOSIS — D500 Iron deficiency anemia secondary to blood loss (chronic): Secondary | ICD-10-CM | POA: Diagnosis not present

## 2018-01-23 LAB — IRON AND TIBC
IRON: 114 ug/dL (ref 41–142)
Saturation Ratios: 40 % (ref 21–57)
TIBC: 286 ug/dL (ref 236–444)
UIBC: 172 ug/dL

## 2018-01-23 LAB — CBC WITH DIFFERENTIAL (CANCER CENTER ONLY)
BASOS PCT: 1 %
Basophils Absolute: 0 10*3/uL (ref 0.0–0.1)
EOS ABS: 0.1 10*3/uL (ref 0.0–0.5)
Eosinophils Relative: 2 %
HCT: 40 % (ref 34.8–46.6)
HEMOGLOBIN: 13.5 g/dL (ref 11.6–15.9)
Lymphocytes Relative: 43 %
Lymphs Abs: 1.8 10*3/uL (ref 0.9–3.3)
MCH: 32.7 pg (ref 26.0–34.0)
MCHC: 33.8 g/dL (ref 32.0–36.0)
MCV: 96.9 fL (ref 81.0–101.0)
MONOS PCT: 11 %
Monocytes Absolute: 0.5 10*3/uL (ref 0.1–0.9)
NEUTROS PCT: 43 %
Neutro Abs: 1.8 10*3/uL (ref 1.5–6.5)
PLATELETS: 200 10*3/uL (ref 145–400)
RBC: 4.13 MIL/uL (ref 3.70–5.32)
RDW: 12.7 % (ref 11.1–15.7)
WBC: 4.1 10*3/uL (ref 3.9–10.0)

## 2018-01-23 LAB — RETICULOCYTES
RBC.: 4.24 MIL/uL (ref 3.70–5.45)
Retic Count, Absolute: 76.3 10*3/uL (ref 33.7–90.7)
Retic Ct Pct: 1.8 % (ref 0.7–2.1)

## 2018-01-23 LAB — FERRITIN: FERRITIN: 72 ng/mL (ref 9–269)

## 2018-01-23 NOTE — Progress Notes (Signed)
Hematology and Oncology Follow Up Visit  Dawn Huber [id number] Apr 28, 1956 62 y.o. 01/23/2018   Principle Diagnosis:  Iron deficiency anemia   Current Therapy:   OTC oral iron supplement 325 mg PO daily    Interim History:  Dawn Huber is here today for follow-up.  Everything is going quite well.  She had a wonderful holiday season.  She had her family with her.  We last saw her back in September 2018.  At that time, her iron studies showed a ferritin of 30 with an iron saturation of 30%.  She is taking over-the-counter iron supplements.  This seems to be working quite well for her.  She has had no problems with bowels or bladder.  She has had no rashes.  She has had no cough or shortness of breath.  She has had no nausea or vomiting.  ECOG Performance Status: 0 - Asymptomatic  Medications:  Allergies as of 01/23/2018      Reactions   Amoxicillin Other (See Comments)   Itching of hands and feet      Medication List        Accurate as of 01/23/18  8:27 AM. Always use your most recent med list.          calcium carbonate 600 MG Tabs tablet Commonly known as:  OS-CAL Take 600 mg by mouth 2 (two) times daily with a meal. Pt taking one tablet a day   diphenhydrAMINE 25 mg capsule Commonly known as:  BENADRYL Take 50 mg by mouth at bedtime.   diphenhydramine-acetaminophen 25-500 MG Tabs tablet Commonly known as:  TYLENOL PM Take 2 tablets by mouth at bedtime.   ferrous sulfate 325 (65 FE) MG tablet Take 325 mg by mouth daily with breakfast.   hydrocortisone-pramoxine rectal foam Commonly known as:  PROCTOFOAM HC Place 1 applicator rectally 2 (two) times daily.   pantoprazole 40 MG tablet Commonly known as:  PROTONIX TAKE 1 TABLET BY MOUTH ONCE DAILY   Pitavastatin Calcium 4 MG Tabs Commonly known as:  LIVALO Take 1 tablet (4 mg total) by mouth daily.   vitamin B-12 1000 MCG tablet Commonly known as:  CYANOCOBALAMIN Take 1,000 mcg by mouth daily. chewable   Zoster Vaccine Adjuvanted injection Commonly known as:  SHINGRIX inject 0.34mcg IM now and again in 2-6 months.       Allergies:  Allergies  Allergen Reactions  . Amoxicillin Other (See Comments)    Itching of hands and feet    Past Medical History, Surgical history, Social history, and Family History were reviewed and updated.  Review of Systems: Review of Systems  Constitutional: Negative.   HENT: Negative.   Eyes: Negative.   Respiratory: Negative.   Cardiovascular: Negative.   Gastrointestinal: Negative.   Genitourinary: Negative.   Musculoskeletal: Negative.   Skin: Negative.   Neurological: Negative.   Endo/Heme/Allergies: Negative.   Psychiatric/Behavioral: Negative.      Physical Exam:  weight is 196 lb 12.8 oz (89.3 kg). Her oral temperature is 98.1 F (36.7 C). Her blood pressure is 133/69 and her pulse is 67. Her oxygen saturation is 98%.   Wt Readings from Last 3 Encounters:  01/23/18 196 lb 12.8 oz (89.3 kg)  10/13/17 205 lb 9.6 oz (93.3 kg)  09/08/17 200 lb (90.7 kg)    Physical Exam  Constitutional: She is oriented to person, place, and time.  HENT:  Head: Normocephalic and atraumatic.  Mouth/Throat: Oropharynx is clear and moist.  Eyes: EOM are normal. Pupils  are equal, round, and reactive to light.  Neck: Normal range of motion.  Cardiovascular: Normal rate, regular rhythm and normal heart sounds.  Pulmonary/Chest: Effort normal and breath sounds normal.  Abdominal: Soft. Bowel sounds are normal.  Musculoskeletal: Normal range of motion. She exhibits no edema, tenderness or deformity.  Lymphadenopathy:    She has no cervical adenopathy.  Neurological: She is alert and oriented to person, place, and time.  Skin: Skin is warm and dry. No rash noted. No erythema.  Psychiatric: She has a normal mood and affect. Her behavior is normal. Judgment and thought content normal.  Vitals reviewed.    Lab Results  Component Value Date   WBC 4.1  01/23/2018   HGB 13.8 09/08/2017   HCT 40.0 01/23/2018   MCV 96.9 01/23/2018   PLT 200 01/23/2018   Lab Results  Component Value Date   FERRITIN 30 09/08/2017   IRON 100 09/08/2017   TIBC 329 09/08/2017   UIBC 229 09/08/2017   IRONPCTSAT 30 09/08/2017   Lab Results  Component Value Date   RBC 4.13 01/23/2018   No results found for: KPAFRELGTCHN, LAMBDASER, KAPLAMBRATIO No results found for: IGGSERUM, IGA, IGMSERUM No results found for: Odetta Pink, SPEI   Chemistry      Component Value Date/Time   NA 142 10/13/2017 0800   NA 135 05/02/2017 1346   K 4.6 10/13/2017 0800   K 4.2 05/02/2017 1346   CL 105 10/13/2017 0800   CL 103 05/02/2017 1346   CO2 30 10/13/2017 0800   CO2 27 05/02/2017 1346   BUN 11 10/13/2017 0800   BUN 11 05/02/2017 1346   CREATININE 0.61 10/13/2017 0800   CREATININE 0.69 05/02/2017 1346   CREATININE 0.70 04/18/2017 1521      Component Value Date/Time   CALCIUM 9.5 10/13/2017 0800   CALCIUM 9.7 05/02/2017 1346   ALKPHOS 51 10/13/2017 0800   ALKPHOS 59 05/02/2017 1346   AST 16 10/13/2017 0800   AST 16 05/02/2017 1346   ALT 12 10/13/2017 0800   ALT 11 05/02/2017 1346   BILITOT 0.4 10/13/2017 0800   BILITOT 0.3 05/02/2017 1346      Impression and Plan: Dawn Huber is a very pleasant 62 yo caucasian female with history of iron deficiency anemia secondary to GI bleed. She has responded nicely to the oral iron supplement and has not required IV iron.    So far, her hemoglobin is stabilizing.  We will see what her iron levels show.  I do not think we have to make any changes with respect to her recommendations.  I do not think we have to embark upon IV iron unless she drops her hemoglobin significantly.  At this point, I think we can just have her come back as necessary.  If she thinks she needs to have her blood checked or if she needs any IV iron, we will be more than happy to see her back.   As long as everything is doing well, I just do not want to bother her as she is quite busy.     Volanda Napoleon, MD 2/1/20198:27 AM

## 2018-04-13 ENCOUNTER — Ambulatory Visit: Payer: Managed Care, Other (non HMO) | Admitting: Family

## 2018-04-17 ENCOUNTER — Encounter: Payer: Self-pay | Admitting: Family

## 2018-04-17 ENCOUNTER — Ambulatory Visit (INDEPENDENT_AMBULATORY_CARE_PROVIDER_SITE_OTHER): Payer: PRIVATE HEALTH INSURANCE | Admitting: Family

## 2018-04-17 VITALS — BP 130/79 | HR 66 | Temp 98.6°F | Resp 18 | Ht 63.0 in | Wt 197.2 lb

## 2018-04-17 DIAGNOSIS — K219 Gastro-esophageal reflux disease without esophagitis: Secondary | ICD-10-CM

## 2018-04-17 DIAGNOSIS — D649 Anemia, unspecified: Secondary | ICD-10-CM

## 2018-04-17 DIAGNOSIS — E785 Hyperlipidemia, unspecified: Secondary | ICD-10-CM

## 2018-04-17 LAB — CBC WITH DIFFERENTIAL/PLATELET
BASOS PCT: 1.2 % (ref 0.0–3.0)
Basophils Absolute: 0 10*3/uL (ref 0.0–0.1)
EOS ABS: 0.1 10*3/uL (ref 0.0–0.7)
Eosinophils Relative: 3.1 % (ref 0.0–5.0)
HCT: 39.6 % (ref 36.0–46.0)
HEMOGLOBIN: 13.4 g/dL (ref 12.0–15.0)
LYMPHS ABS: 1.6 10*3/uL (ref 0.7–4.0)
Lymphocytes Relative: 43.2 % (ref 12.0–46.0)
MCHC: 33.8 g/dL (ref 30.0–36.0)
MCV: 97.2 fl (ref 78.0–100.0)
MONO ABS: 0.3 10*3/uL (ref 0.1–1.0)
Monocytes Relative: 9.6 % (ref 3.0–12.0)
NEUTROS PCT: 42.9 % — AB (ref 43.0–77.0)
Neutro Abs: 1.5 10*3/uL (ref 1.4–7.7)
PLATELETS: 187 10*3/uL (ref 150.0–400.0)
RBC: 4.07 Mil/uL (ref 3.87–5.11)
RDW: 13.1 % (ref 11.5–15.5)
WBC: 3.6 10*3/uL — AB (ref 4.0–10.5)

## 2018-04-17 LAB — IRON: IRON: 62 ug/dL (ref 42–145)

## 2018-04-17 MED ORDER — LANSOPRAZOLE 30 MG PO CPDR: 30.0000 mg | DELAYED_RELEASE_CAPSULE | Freq: Every day | ORAL | Status: DC

## 2018-04-17 MED ORDER — PITAVASTATIN CALCIUM 4 MG PO TABS: 1.0000 | ORAL_TABLET | Freq: Every day | ORAL | 3 refills | Status: DC

## 2018-04-17 NOTE — Patient Instructions (Signed)
Please complete lab work prior to leaving.   

## 2018-04-17 NOTE — Progress Notes (Signed)
Subjective:    Patient ID: Dawn Huber, female    DOB: 1956/01/20, 62 y.o.   MRN: [id number]  HPI  Dawn Huber is a 62 yr old female who presents today for follow up:  Hyperlipidemia- maintained on livalo.   Lab Results  Component Value Date   CHOL 175 10/13/2017   HDL 56.90 10/13/2017   LDLCALC 84 10/13/2017   LDLDIRECT 172.0 07/10/2015   TRIG 173.0 (H) 10/13/2017   CHOLHDL 3 10/13/2017   Anemia- stopped iron supplement.  Lab Results  Component Value Date   WBC 4.1 01/23/2018   HGB 13.5 01/23/2018   HCT 40.0 01/23/2018   MCV 96.9 01/23/2018   PLT 200 01/23/2018   GERD- stable.  Continues protonix 40mg  once daily.      Review of Systems See HPI  Past Medical History:  Diagnosis Date  . Diverticulosis   . Genital warts   . GI bleed due to NSAIDs 03/2017   hospitalized at [facility]  . History of chicken pox   . Hypertension   . Osteopenia 02/18/2012  . Personal history of colonic polyps      Social History   Socioeconomic History  . Marital status: Married    Spouse name: Not on file  . Number of children: 2  . Years of education: Not on file  . Highest education level: Not on file  Occupational History  . Not on file  Social Needs  . Financial resource strain: Not on file  . Food insecurity:    Worry: Not on file    Inability: Not on file  . Transportation needs:    Medical: Not on file    Non-medical: Not on file  Tobacco Use  . Smoking status: Former Smoker    Last attempt to quit: 08/16/2013    Years since quitting: 4.6  . Smokeless tobacco: Never Used  . Tobacco comment: Pt states she "vapes".  Substance and Sexual Activity  . Alcohol use: No    Alcohol/week: 0.0 oz  . Drug use: No  . Sexual activity: Never  Lifestyle  . Physical activity:    Days per week: Not on file    Minutes per session: Not on file  . Stress: Not on file  Relationships  . Social connections:    Talks on phone: Not on file    Gets together: Not on file      Attends religious service: Not on file    Active member of club or organization: Not on file    Attends meetings of clubs or organizations: Not on file    Relationship status: Not on file  . Intimate partner violence:    Fear of current or ex partner: Not on file    Emotionally abused: Not on file    Physically abused: Not on file    Forced sexual activity: Not on file  Other Topics Concern  . Not on file  Social History Narrative   Regular exercise:  No, walks dog daily   Caffeine Use:  12oz pepsi daily   Works as a Haematologist.   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   Married           Past Surgical History:  Procedure Laterality Date  . ABDOMINAL HYSTERECTOMY  1999   partial  . TUBAL LIGATION      Family History  Problem Relation Age of Onset  . Cancer Mother  lung  . Emphysema Father   . Stroke Father   . Seizures Father   . Heart disease Father   . Hypertension Father   . Colon cancer Maternal Aunt 9  . Hypertension Brother   . Gout Son   . Osteoarthritis Daughter   . Varicose Veins Daughter   . Breast cancer Neg Hx     Allergies  Allergen Reactions  . Amoxicillin Other (See Comments)    Itching of hands and feet    Current Outpatient Medications on File Prior to Visit  Medication Sig Dispense Refill  . calcium carbonate (OS-CAL) 600 MG TABS tablet Take 600 mg by mouth 2 (two) times daily with a meal. Pt taking one tablet a day    . diphenhydrAMINE (BENADRYL) 25 mg capsule Take 50 mg by mouth at bedtime.    . diphenhydramine-acetaminophen (TYLENOL PM) 25-500 MG TABS tablet Take 2 tablets by mouth at bedtime.     No current facility-administered medications on file prior to visit.     BP 130/79 (BP Location: Right Arm, Cuff Size: Normal)   Pulse 66   Temp 98.6 F (37 C) (Oral)   Resp 18   Ht 5\' 3"  (1.6 m)   Wt 197 lb 3.2 oz (89.4 kg)   SpO2 99%   BMI 34.93 kg/m       Objective:   Physical Exam   Constitutional: She appears well-developed and well-nourished.  HENT:  Head: Normocephalic and atraumatic.  Mouth/Throat: No oropharyngeal exudate.  Cardiovascular: Normal rate, regular rhythm and normal heart sounds.  No murmur heard. Pulmonary/Chest: Effort normal and breath sounds normal. No respiratory distress. She has no wheezes.  Psychiatric: She has a normal mood and affect. Her behavior is normal. Judgment and thought content normal.          Assessment & Plan:  Anemia-clinically stable.  She discontinued iron greater than 1 month ago.  Will repeat CBC, serum iron, and ferritin today.  I suspect that her anemia was related to GI loss in the setting of NSAID use.  She is no longer on NSAIDs.  GERD- clinically stable on Protonix.  We will step her down to over-the-counter Prevacid once daily as needed.  Hyperlipidemia-tolerating Livalo.  LDL at goal.

## 2018-04-19 ENCOUNTER — Encounter: Payer: Self-pay | Admitting: Family

## 2018-06-02 ENCOUNTER — Encounter: Payer: Self-pay | Admitting: Family

## 2018-06-03 ENCOUNTER — Encounter: Payer: Self-pay | Admitting: Family

## 2018-06-03 MED ORDER — PANTOPRAZOLE SODIUM 40 MG PO TBEC: 40.0000 mg | DELAYED_RELEASE_TABLET | Freq: Every day | ORAL | 3 refills | Status: DC

## 2018-06-03 NOTE — Telephone Encounter (Signed)
Rx was phoned into pharmacy. 

## 2018-09-21 ENCOUNTER — Other Ambulatory Visit: Payer: Self-pay | Admitting: Family

## 2018-10-16 ENCOUNTER — Encounter: Payer: Self-pay | Admitting: Family

## 2018-10-16 ENCOUNTER — Ambulatory Visit (INDEPENDENT_AMBULATORY_CARE_PROVIDER_SITE_OTHER): Payer: PRIVATE HEALTH INSURANCE | Admitting: Family

## 2018-10-16 VITALS — BP 130/80 | HR 72 | Temp 98.4°F | Resp 18 | Ht 63.0 in | Wt 200.6 lb

## 2018-10-16 DIAGNOSIS — Z0000 Encounter for general adult medical examination without abnormal findings: Secondary | ICD-10-CM

## 2018-10-16 DIAGNOSIS — D649 Anemia, unspecified: Secondary | ICD-10-CM

## 2018-10-16 DIAGNOSIS — M8580 Other specified disorders of bone density and structure, unspecified site: Secondary | ICD-10-CM

## 2018-10-16 LAB — HEPATIC FUNCTION PANEL
ALBUMIN: 4.6 g/dL (ref 3.5–5.2)
ALK PHOS: 65 U/L (ref 39–117)
ALT: 21 U/L (ref 0–35)
AST: 17 U/L (ref 0–37)
BILIRUBIN TOTAL: 0.4 mg/dL (ref 0.2–1.2)
Bilirubin, Direct: 0.1 mg/dL (ref 0.0–0.3)
Total Protein: 7.1 g/dL (ref 6.0–8.3)

## 2018-10-16 LAB — URINALYSIS, ROUTINE W REFLEX MICROSCOPIC
Bilirubin Urine: NEGATIVE
KETONES UR: NEGATIVE
NITRITE: NEGATIVE
Specific Gravity, Urine: 1.02 (ref 1.000–1.030)
Total Protein, Urine: NEGATIVE
Urine Glucose: NEGATIVE
Urobilinogen, UA: 0.2 (ref 0.0–1.0)
pH: 6 (ref 5.0–8.0)

## 2018-10-16 LAB — CBC WITH DIFFERENTIAL/PLATELET
BASOS ABS: 0 10*3/uL (ref 0.0–0.1)
Basophils Relative: 0.9 % (ref 0.0–3.0)
Eosinophils Absolute: 0.1 10*3/uL (ref 0.0–0.7)
Eosinophils Relative: 2.6 % (ref 0.0–5.0)
HCT: 33.7 % — ABNORMAL LOW (ref 36.0–46.0)
Hemoglobin: 10.8 g/dL — ABNORMAL LOW (ref 12.0–15.0)
LYMPHS ABS: 1.6 10*3/uL (ref 0.7–4.0)
Lymphocytes Relative: 41.8 % (ref 12.0–46.0)
MCHC: 32 g/dL (ref 30.0–36.0)
MCV: 80.1 fl (ref 78.0–100.0)
MONO ABS: 0.4 10*3/uL (ref 0.1–1.0)
MONOS PCT: 9.5 % (ref 3.0–12.0)
NEUTROS ABS: 1.7 10*3/uL (ref 1.4–7.7)
NEUTROS PCT: 45.2 % (ref 43.0–77.0)
Platelets: 242 10*3/uL (ref 150.0–400.0)
RBC: 4.21 Mil/uL (ref 3.87–5.11)
RDW: 17.1 % — ABNORMAL HIGH (ref 11.5–15.5)
WBC: 3.8 10*3/uL — ABNORMAL LOW (ref 4.0–10.5)

## 2018-10-16 LAB — LIPID PANEL
Cholesterol: 172 mg/dL (ref 0–200)
HDL: 66.1 mg/dL (ref >39.00)
LDL CALC: 85 mg/dL (ref 0–99)
NonHDL: 106.38
Total CHOL/HDL Ratio: 3
Triglycerides: 108 mg/dL (ref 0.0–149.0)
VLDL: 21.6 mg/dL (ref 0.0–40.0)

## 2018-10-16 LAB — BASIC METABOLIC PANEL
BUN: 16 mg/dL (ref 6–23)
CALCIUM: 9.4 mg/dL (ref 8.4–10.5)
CO2: 28 meq/L (ref 19–32)
CREATININE: 0.59 mg/dL (ref 0.40–1.20)
Chloride: 106 mEq/L (ref 96–112)
GFR: 110.15 mL/min (ref >60.00)
GLUCOSE: 110 mg/dL — AB (ref 70–99)
Potassium: 4.3 mEq/L (ref 3.5–5.1)
SODIUM: 141 meq/L (ref 135–145)

## 2018-10-16 LAB — TSH: TSH: 1.05 u[IU]/mL (ref 0.35–4.50)

## 2018-10-16 LAB — IRON: IRON: 32 ug/dL — AB (ref 42–145)

## 2018-10-16 NOTE — Progress Notes (Signed)
Subjective:    Patient ID: Dawn Huber, female    DOB: [birth year], 62 y.o.   MRN: [id number]  HPI  Patient presents today for complete physical.  Immunizations:  tdap and flu up to date.  Declines zostavax due to cost Diet:  Doing keto diet Wt Readings from Last 3 Encounters:  10/16/18 200 lb 9.6 oz (91 kg)  04/17/18 197 lb 3.2 oz (89.4 kg)  01/23/18 196 lb 12.8 oz (89.3 kg)  Exercise: no formal exercise  Colonoscopy: 2016, due 2021 Dexa: 2013- osteopenia.  Pap Smear: hysterectomy Mammogram: 11/18, due Vision: due Dental:  Up to date    Review of Systems  Constitutional: Negative for unexpected weight change.  HENT: Negative for rhinorrhea.        Some hearing loss in the left year  Eyes: Negative for visual disturbance.  Respiratory: Negative for cough.   Cardiovascular:       Reports occasional "puffiness" in her ankles  Gastrointestinal: Negative for constipation and diarrhea.  Genitourinary: Negative for dysuria and frequency.  Musculoskeletal: Positive for back pain.  Skin: Negative for rash.  Neurological:       Occasional headaches  Hematological: Negative for adenopathy.  Psychiatric/Behavioral:       Denies depression, or anxiety (except at work)   Past Medical History:  Diagnosis Date  . Diverticulosis   . Genital warts   . GI bleed due to NSAIDs 03/2017   hospitalized at [facility]  . History of chicken pox   . Hypertension   . Osteopenia 02/18/2012  . Personal history of colonic polyps      Social History   Socioeconomic History  . Marital status: Married    Spouse name: Not on file  . Number of children: 2  . Years of education: Not on file  . Highest education level: Not on file  Occupational History  . Not on file  Social Needs  . Financial resource strain: Not on file  . Food insecurity:    Worry: Not on file    Inability: Not on file  . Transportation needs:    Medical: Not on file    Non-medical: Not on file  Tobacco Use    . Smoking status: Former Smoker    Last attempt to quit: 08/16/2013    Years since quitting: 5.1  . Smokeless tobacco: Never Used  . Tobacco comment: Pt states she "vapes".  Substance and Sexual Activity  . Alcohol use: No    Alcohol/week: 0.0 standard drinks  . Drug use: No  . Sexual activity: Never  Lifestyle  . Physical activity:    Days per week: Not on file    Minutes per session: Not on file  . Stress: Not on file  Relationships  . Social connections:    Talks on phone: Not on file    Gets together: Not on file    Attends religious service: Not on file    Active member of club or organization: Not on file    Attends meetings of clubs or organizations: Not on file    Relationship status: Not on file  . Intimate partner violence:    Fear of current or ex partner: Not on file    Emotionally abused: Not on file    Physically abused: Not on file    Forced sexual activity: Not on file  Other Topics Concern  . Not on file  Social History Narrative   Regular exercise:  No, walks dog daily  Caffeine Use:  12oz pepsi daily   Works as a Haematologist.   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   Married           Past Surgical History:  Procedure Laterality Date  . ABDOMINAL HYSTERECTOMY  1999   partial  . TUBAL LIGATION      Family History  Problem Relation Age of Onset  . Cancer Mother        lung  . Emphysema Father   . Stroke Father   . Seizures Father   . Heart disease Father   . Hypertension Father   . Colon cancer Maternal Aunt 13  . Hypertension Brother   . Gout Son   . Osteoarthritis Daughter   . Varicose Veins Daughter   . Breast cancer Neg Hx     Allergies  Allergen Reactions  . Amoxicillin Other (See Comments)    Itching of hands and feet    Current Outpatient Medications on File Prior to Visit  Medication Sig Dispense Refill  . calcium carbonate (OS-CAL) 600 MG TABS tablet Take 600 mg by mouth 2 (two) times  daily with a meal. Pt taking one tablet a day    . diphenhydrAMINE (BENADRYL) 25 mg capsule Take 50 mg by mouth at bedtime.    . diphenhydramine-acetaminophen (TYLENOL PM) 25-500 MG TABS tablet Take 2 tablets by mouth at bedtime.    . pantoprazole (PROTONIX) 40 MG tablet Take 1 tablet (40 mg total) by mouth daily. 90 tablet 3  . Pitavastatin Calcium (LIVALO) 4 MG TABS Take 1 tablet (4 mg total) by mouth daily. 90 tablet 3   No current facility-administered medications on file prior to visit.     BP 130/80 (BP Location: Right Arm, Cuff Size: Large)   Pulse 72   Temp 98.4 F (36.9 C) (Oral)   Resp 18   Ht 5\' 3"  (1.6 m)   Wt 200 lb 9.6 oz (91 kg)   SpO2 98%   BMI 35.53 kg/m       Objective:   Physical Exam Physical Exam  Constitutional: She is oriented to person, place, and time. She appears well-developed and well-nourished. No distress.  HENT:  Head: Normocephalic and atraumatic.  Right Ear: Tympanic membrane and ear canal normal.  Left Ear: Tympanic membrane and ear canal normal.  Mouth/Throat: Oropharynx is clear and moist.  Eyes: Pupils are equal, round, and reactive to light. No scleral icterus.  Neck: Normal range of motion. No thyromegaly present.  Cardiovascular: Normal rate and regular rhythm.   No murmur heard. Pulmonary/Chest: Effort normal and breath sounds normal. No respiratory distress. He has no wheezes. She has no rales. She exhibits no tenderness.  Abdominal: Soft. Bowel sounds are normal. She exhibits no distension and no mass. There is no tenderness. There is no rebound and no guarding.  Musculoskeletal: She exhibits no edema.  Lymphadenopathy:    She has no cervical adenopathy.  Neurological: She is alert and oriented to person, place, and time. She has normal patellar reflexes. She exhibits normal muscle tone. Coordination normal.  Skin: Skin is warm and dry.  Psychiatric: She has a normal mood and affect. Her behavior is normal. Judgment and thought  content normal.  Breasts: Examined lying Right: Without masses, retractions, discharge or axillary adenopathy.  Left: Without masses, retractions, discharge or axillary adenopathy.  Pelvic: deferred       Assessment & Plan:  Assessment & Plan:  Preventative care- discussed healthy diet, exercise, weight loss. Colo up to date. Due for dexa/mammogram.  EKG tracing is personally reviewed.  EKG notes NSR.  No acute changes.

## 2018-10-16 NOTE — Patient Instructions (Addendum)
Please complete lab work prior to leaving. Please schedule a routine eye exam.   Try to get 30 minutes of exercise 5 days a week.

## 2018-10-17 ENCOUNTER — Encounter: Payer: Self-pay | Admitting: Family

## 2018-10-17 ENCOUNTER — Telehealth: Payer: Self-pay | Admitting: Family

## 2018-10-17 NOTE — Telephone Encounter (Signed)
See mychart.  

## 2018-10-19 ENCOUNTER — Encounter: Payer: Self-pay | Admitting: Family

## 2018-10-19 NOTE — Telephone Encounter (Signed)
Results released. See previous email.

## 2018-10-21 ENCOUNTER — Encounter: Payer: Self-pay | Admitting: Family

## 2019-03-16 ENCOUNTER — Encounter: Payer: Self-pay | Admitting: Family

## 2019-03-16 ENCOUNTER — Telehealth: Payer: PRIVATE HEALTH INSURANCE | Admitting: Physician Assistant

## 2019-03-16 ENCOUNTER — Encounter: Payer: Self-pay | Admitting: Physician Assistant

## 2019-03-16 DIAGNOSIS — B029 Zoster without complications: Secondary | ICD-10-CM | POA: Diagnosis not present

## 2019-03-16 MED ORDER — GABAPENTIN 300 MG PO CAPS: 300.0000 mg | ORAL_CAPSULE | Freq: Two times a day (BID) | ORAL | 0 refills | Status: DC

## 2019-03-16 MED ORDER — ACYCLOVIR 800 MG PO TABS: 800.0000 mg | ORAL_TABLET | Freq: Every day | ORAL | 0 refills | Status: DC

## 2019-03-16 NOTE — Progress Notes (Signed)
E-visit for Shingles   We are sorry that you are not feeling well. Here is how we plan to help!  Based on what you shared with me it looks like you have shingles.  Shingles or herpes zoster, is a common infection of the nerves.  It is a painful rash caused by the herpes zoster virus.  This is the same virus that causes chickenpox.  After a person has chickenpox, the virus remains inactive in the nerve cells.  Years later, the virus can become active again and travel to the skin.  It typically will appear on one side of the face or body.  Burning or shooting pain, tingling, or itching are early signs of the infection.  Blisters typically scab over in 7 to 10 days and clear up within 2-4 weeks. Shingles is only contagious to people that have never had the chickenpox, the chickenpox vaccine, or anyone who has a compromised immune system.  You should avoid contact with these type of people until your blisters scab over.  I have prescribed Valacyclovir 800 mg times daily for 7 days and also Gabapentin 300mg  twice daily as needed for pain  Diagnosis supported by vesicular rash in linear fashion similar to previous episode in patient.    HOME CARE: . Apply ice packs (wrapped in a thin towel), cool compresses, or soak in cool bath to help reduce pain. . Use calamine lotion to calm itchy skin. . Avoid scratching the rash. . Avoid direct sunlight.  GET HELP RIGHT AWAY IF: . Symptoms that don't away after treatment. . A rash or blisters near your eye. . Increased drainage, fever, or rash after treatment. . Severe pain that doesn't go away.   MAKE SURE YOU    Understand these instructions.  Will watch your condition.  Will get help right away if you are not doing well or get worse.  Thank you for choosing an e-visit. Your e-visit answers were reviewed by a board certified advanced clinical practitioner to complete your personal care plan. Depending upon the condition, your plan could have  included both over the counter or prescription medications.  Please review your pharmacy choice. Make sure the pharmacy is open so you can pick up prescription now. If there is a problem, you may contact your provider through [employer] and have the prescription routed to another pharmacy.  Your safety is important to [place]. If you have drug allergies check your prescription carefully.   For the next 24 hours you can use MyChart to ask questions about today's visit, request a non-urgent call back, or ask for a work or school excuse.  You will get an email in the next two days asking about your experience. I hope that your e-visit has been valuable and will speed your recovery   I have spent 7 min in completion and review of this note- Lacy Duverney [facility]

## 2019-05-10 ENCOUNTER — Other Ambulatory Visit: Payer: Self-pay | Admitting: Family

## 2019-06-09 ENCOUNTER — Other Ambulatory Visit: Payer: Self-pay | Admitting: Family

## 2019-06-10 ENCOUNTER — Encounter: Payer: Self-pay | Admitting: Family

## 2019-07-08 ENCOUNTER — Other Ambulatory Visit: Payer: Self-pay | Admitting: Family

## 2019-08-07 ENCOUNTER — Other Ambulatory Visit: Payer: Self-pay | Admitting: Family

## 2019-09-09 ENCOUNTER — Other Ambulatory Visit: Payer: Self-pay | Admitting: *Deleted

## 2019-09-09 MED ORDER — PANTOPRAZOLE SODIUM 40 MG PO TBEC: 40.0000 mg | DELAYED_RELEASE_TABLET | Freq: Every day | ORAL | 0 refills | Status: DC

## 2019-10-19 ENCOUNTER — Other Ambulatory Visit: Payer: Self-pay

## 2019-10-20 ENCOUNTER — Encounter: Payer: PRIVATE HEALTH INSURANCE | Admitting: Family

## 2019-10-29 ENCOUNTER — Encounter: Payer: PRIVATE HEALTH INSURANCE | Admitting: Family

## 2019-11-16 ENCOUNTER — Encounter: Payer: PRIVATE HEALTH INSURANCE | Admitting: Family

## 2019-12-08 ENCOUNTER — Ambulatory Visit (INDEPENDENT_AMBULATORY_CARE_PROVIDER_SITE_OTHER): Payer: PRIVATE HEALTH INSURANCE | Admitting: Family

## 2019-12-08 ENCOUNTER — Other Ambulatory Visit: Payer: Self-pay

## 2019-12-08 ENCOUNTER — Encounter: Payer: Self-pay | Admitting: Family

## 2019-12-08 VITALS — BP 106/62 | HR 92 | Temp 97.5°F | Resp 15 | Ht 63.0 in | Wt 206.0 lb

## 2019-12-08 DIAGNOSIS — E559 Vitamin D deficiency, unspecified: Secondary | ICD-10-CM | POA: Diagnosis not present

## 2019-12-08 DIAGNOSIS — E785 Hyperlipidemia, unspecified: Secondary | ICD-10-CM | POA: Diagnosis not present

## 2019-12-08 DIAGNOSIS — D509 Iron deficiency anemia, unspecified: Secondary | ICD-10-CM

## 2019-12-08 DIAGNOSIS — Z0000 Encounter for general adult medical examination without abnormal findings: Secondary | ICD-10-CM

## 2019-12-08 DIAGNOSIS — K219 Gastro-esophageal reflux disease without esophagitis: Secondary | ICD-10-CM | POA: Diagnosis not present

## 2019-12-08 DIAGNOSIS — M8580 Other specified disorders of bone density and structure, unspecified site: Secondary | ICD-10-CM

## 2019-12-08 LAB — LIPID PANEL
Cholesterol: 179 mg/dL (ref 0–200)
HDL: 63.7 mg/dL (ref >39.00)
LDL Cholesterol: 81 mg/dL (ref 0–99)
NonHDL: 115.21
Total CHOL/HDL Ratio: 3
Triglycerides: 170 mg/dL — ABNORMAL HIGH (ref 0.0–149.0)
VLDL: 34 mg/dL (ref 0.0–40.0)

## 2019-12-08 LAB — HEPATIC FUNCTION PANEL
ALT: 24 U/L (ref 0–35)
AST: 22 U/L (ref 0–37)
Albumin: 4.7 g/dL (ref 3.5–5.2)
Alkaline Phosphatase: 65 U/L (ref 39–117)
Bilirubin, Direct: 0.1 mg/dL (ref 0.0–0.3)
Total Bilirubin: 0.5 mg/dL (ref 0.2–1.2)
Total Protein: 7 g/dL (ref 6.0–8.3)

## 2019-12-08 LAB — CBC WITH DIFFERENTIAL/PLATELET
Basophils Absolute: 0 10*3/uL (ref 0.0–0.1)
Basophils Relative: 0.8 % (ref 0.0–3.0)
Eosinophils Absolute: 0.1 10*3/uL (ref 0.0–0.7)
Eosinophils Relative: 1.8 % (ref 0.0–5.0)
HCT: 41.7 % (ref 36.0–46.0)
Hemoglobin: 14 g/dL (ref 12.0–15.0)
Lymphocytes Relative: 35.2 % (ref 12.0–46.0)
Lymphs Abs: 1.8 10*3/uL (ref 0.7–4.0)
MCHC: 33.6 g/dL (ref 30.0–36.0)
MCV: 96 fl (ref 78.0–100.0)
Monocytes Absolute: 0.5 10*3/uL (ref 0.1–1.0)
Monocytes Relative: 9.4 % (ref 3.0–12.0)
Neutro Abs: 2.7 10*3/uL (ref 1.4–7.7)
Neutrophils Relative %: 52.8 % (ref 43.0–77.0)
Platelets: 230 10*3/uL (ref 150.0–400.0)
RBC: 4.35 Mil/uL (ref 3.87–5.11)
RDW: 13.1 % (ref 11.5–15.5)
WBC: 5.1 10*3/uL (ref 4.0–10.5)

## 2019-12-08 LAB — BASIC METABOLIC PANEL
BUN: 12 mg/dL (ref 6–23)
CO2: 29 mEq/L (ref 19–32)
Calcium: 10.1 mg/dL (ref 8.4–10.5)
Chloride: 102 mEq/L (ref 96–112)
Creatinine, Ser: 0.69 mg/dL (ref 0.40–1.20)
GFR: 86.18 mL/min (ref >60.00)
Glucose, Bld: 111 mg/dL — ABNORMAL HIGH (ref 70–99)
Potassium: 4.8 mEq/L (ref 3.5–5.1)
Sodium: 137 mEq/L (ref 135–145)

## 2019-12-08 LAB — TSH: TSH: 1.17 u[IU]/mL (ref 0.35–4.50)

## 2019-12-08 LAB — FERRITIN: Ferritin: 60 ng/mL (ref 10.0–291.0)

## 2019-12-08 LAB — IRON: Iron: 156 ug/dL — ABNORMAL HIGH (ref 42–145)

## 2019-12-08 MED ORDER — PANTOPRAZOLE SODIUM 40 MG PO TBEC: 40.0000 mg | DELAYED_RELEASE_TABLET | Freq: Every day | ORAL | 3 refills | Status: DC

## 2019-12-08 MED ORDER — LIVALO 4 MG PO TABS: 1.0000 | ORAL_TABLET | Freq: Every day | ORAL | 3 refills | Status: DC

## 2019-12-08 NOTE — Progress Notes (Signed)
Subjective:    Patient ID: Dawn Huber, female    DOB: Feb 01, 1956, 63 y.o.   MRN: [id number]  HPI  Patient presents today for complete physical.  Immunizations:  tdap 2014, flu shot up to date, has not had shingrix Diet:  Needs improvement Wt Readings from Last 3 Encounters:  12/08/19 206 lb (93.4 kg)  10/16/18 200 lb 9.6 oz (91 kg)  04/17/18 197 lb 3.2 oz (89.4 kg)  Exercise: very little Colonoscopy:09/13/15 (due for 5 year follow up) Dexa: due Pap Smear: s/p hysterectomy Mammogram: 11/10/17 Dental: due Vision: due  Hyperlipidemia- maintained on livalo Lab Results  Component Value Date   CHOL 172 10/16/2018   HDL 66.10 10/16/2018   LDLCALC 85 10/16/2018   LDLDIRECT 172.0 07/10/2015   TRIG 108.0 10/16/2018   CHOLHDL 3 10/16/2018   Hx of GERD-  Generally well controlled with PPI   Review of Systems  Constitutional: Negative for unexpected weight change.  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 Negative for rhinorrhea.   Eyes: Negative for visual disturbance.  Respiratory: Negative for cough.   Cardiovascular: Negative for chest pain.  Gastrointestinal: Negative for constipation and diarrhea.  Genitourinary: Negative for dysuria and frequency.  Musculoskeletal: Negative for arthralgias and myalgias.  Skin: Negative for rash.  Neurological: Negative for headaches.  Hematological: Negative for adenopathy.  Psychiatric/Behavioral:       Denies anxiety, some situational depression due to covid   Past Medical History:  Diagnosis Date  . Diverticulosis   . Genital warts   . GI bleed due to NSAIDs 03/2017   hospitalized at [facility]  . History of chicken pox   . Hypertension   . Osteopenia 02/18/2012  . Personal history of colonic polyps      Social History   Socioeconomic History  . Marital status: Married    Spouse name: Not on file  . Number of children: 2  . Years of education: Not on file  . Highest education  level: Not on file  Occupational History  . Not on file  Tobacco Use  . Smoking status: Former Smoker    Quit date: 08/16/2013    Years since quitting: 6.3  . Smokeless tobacco: Never Used  . Tobacco comment: Pt states she "vapes".  Substance and Sexual Activity  . Alcohol use: No    Alcohol/week: 0.0 standard drinks  . Drug use: No  . Sexual activity: Never  Other Topics Concern  . Not on file  Social History Narrative   Regular exercise:  No, walks dog daily   Caffeine Use:  12oz pepsi daily   Works as a Haematologist.   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   Married          Social Determinants of Radio broadcast assistant Strain:   . Difficulty of Paying Living Expenses: Not on file  Food Insecurity:   . Worried About Charity fundraiser in the Last Year: Not on file  . Ran Out of Food in the Last Year: Not on file  Transportation Needs:   . Lack of Transportation (Medical): Not on file  . Lack of Transportation (Non-Medical): Not on file  Physical Activity:   . Days of Exercise per Week: Not on file  . Minutes of Exercise per Session: Not on file  Stress:   . Feeling of Stress : Not on file  Social Connections:   . Frequency  of Communication with Friends and Family: Not on file  . Frequency of Social Gatherings with Friends and Family: Not on file  . Attends Religious Services: Not on file  . Active Member of Clubs or Organizations: Not on file  . Attends Archivist Meetings: Not on file  . Marital Status: Not on file  Intimate Partner Violence:   . Fear of Current or Ex-Partner: Not on file  . Emotionally Abused: Not on file  . Physically Abused: Not on file  . Sexually Abused: Not on file    Past Surgical History:  Procedure Laterality Date  . ABDOMINAL HYSTERECTOMY  1999   partial  . TUBAL LIGATION      Family History  Problem Relation Age of Onset  . Cancer Mother        lung  . Emphysema Father   .  Stroke Father   . Seizures Father   . Heart disease Father   . Hypertension Father   . Colon cancer Maternal Aunt 24  . Hypertension Brother   . Gout Son   . Osteoarthritis Daughter   . Varicose Veins Daughter   . Breast cancer Neg Hx     Allergies  Allergen Reactions  . Amoxicillin Other (See Comments)    Itching of hands and feet    Current Outpatient Medications on File Prior to Visit  Medication Sig Dispense Refill  . calcium carbonate (OS-CAL) 600 MG TABS tablet Take 600 mg by mouth 2 (two) times daily with a meal. Pt taking one tablet a day    . diphenhydrAMINE (BENADRYL) 25 mg capsule Take 50 mg by mouth at bedtime.    . diphenhydramine-acetaminophen (TYLENOL PM) 25-500 MG TABS tablet Take 2 tablets by mouth at bedtime.    Marland Kitchen LIVALO 4 MG TABS Take 1 tablet by mouth once daily 30 tablet 3  . pantoprazole (PROTONIX) 40 MG tablet Take 1 tablet (40 mg total) by mouth daily. 90 tablet 0   No current facility-administered medications on file prior to visit.    BP 106/62 (BP Location: Right Arm, Patient Position: Sitting, Cuff Size: Large)   Pulse 92   Temp (!) 97.5 F (36.4 C) (Temporal)   Resp 15   Ht 5\' 3"  (1.6 m)   Wt 206 lb (93.4 kg)   SpO2 98%   BMI 36.49 kg/m       Objective:   Physical Exam  Physical Exam  Constitutional: She is oriented to person, place, and time. She appears well-developed and well-nourished. No distress.  HENT:  Head: Normocephalic and atraumatic.  Right Ear: Tympanic membrane and ear canal normal.  Left Ear: Tympanic membrane and ear canal normal.  Mouth/Throat: Not examined- pt wearing mask  Eyes: Pupils are equal, round, and reactive to light. No scleral icterus.  Neck: Normal range of motion. No thyromegaly present.  Cardiovascular: Normal rate and regular rhythm.   No murmur heard. Pulmonary/Chest: Effort normal and breath sounds normal. No respiratory distress. He has no wheezes. She has no rales. She exhibits no tenderness.    Abdominal: Soft. Bowel sounds are normal. She exhibits no distension and no mass. There is no tenderness. There is no rebound and no guarding.  Musculoskeletal: She exhibits no edema.  Lymphadenopathy:    She has no cervical adenopathy.  Neurological: She is alert and oriented to person, place, and time. She has normal patellar reflexes. She exhibits normal muscle tone. Coordination normal.  Skin: Skin is warm and dry.  Psychiatric:  She has a normal mood and affect. Her behavior is normal. Judgment and thought content normal.  Breast/pelvic: deferred          Assessment & Plan:   Preventative care- discussed healthy diet, exercise, weight loss.  Refer for mammogram and dexa.  She will get shingrix from the pharmacy. We discussed need to quit vaping.   GERD- stable on PPI, continue same.  Iron deficiency anemia- check cbc, iron levels.   Hyperlipidemia- obtain follow up lipid panel, continue livalo  Vit d deficiency- obtain follow up vit D.   This visit occurred during the SARS-CoV-2 public health emergency.  Safety protocols were in place, including screening questions prior to the visit, additional usage of staff PPE, and extensive cleaning of exam room while observing appropriate contact time as indicated for disinfecting solutions.       Assessment & Plan:

## 2019-12-08 NOTE — Patient Instructions (Addendum)
Please complete lab work prior to leaving.    Preventive Care 40-62 Years Old, Female Preventive care refers to visits with your health care provider and lifestyle choices that can promote health and wellness. This includes:  A yearly physical exam. This may also be called an annual well check.  Regular dental visits and eye exams.  Immunizations.  Screening for certain conditions.  Healthy lifestyle choices, such as eating a healthy diet, getting regular exercise, not using drugs or products that contain nicotine and tobacco, and limiting alcohol use. What can I expect for my preventive care visit? Physical exam Your health care provider will check your:  Height and weight. This may be used to calculate body mass index (BMI), which tells if you are at a healthy weight.  Heart rate and blood pressure.  Skin for abnormal spots. Counseling Your health care provider may ask you questions about your:  Alcohol, tobacco, and drug use.  Emotional well-being.  Home and relationship well-being.  Sexual activity.  Eating habits.  Work and work environment.  Method of birth control.  Menstrual cycle.  Pregnancy history. What immunizations do I need?  Influenza (flu) vaccine  This is recommended every year. Tetanus, diphtheria, and pertussis (Tdap) vaccine  You may need a Td booster every 10 years. Varicella (chickenpox) vaccine  You may need this if you have not been vaccinated. Zoster (shingles) vaccine  You may need this after age 60. Measles, mumps, and rubella (MMR) vaccine  You may need at least one dose of MMR if you were born in 1957 or later. You may also need a second dose. Pneumococcal conjugate (PCV13) vaccine  You may need this if you have certain conditions and were not previously vaccinated. Pneumococcal polysaccharide (PPSV23) vaccine  You may need one or two doses if you smoke cigarettes or if you have certain conditions. Meningococcal conjugate  (MenACWY) vaccine  You may need this if you have certain conditions. Hepatitis A vaccine  You may need this if you have certain conditions or if you travel or work in places where you may be exposed to hepatitis A. Hepatitis B vaccine  You may need this if you have certain conditions or if you travel or work in places where you may be exposed to hepatitis B. Haemophilus influenzae type b (Hib) vaccine  You may need this if you have certain conditions. Human papillomavirus (HPV) vaccine  If recommended by your health care provider, you may need three doses over 6 months. You may receive vaccines as individual doses or as more than one vaccine together in one shot (combination vaccines). Talk with your health care provider about the risks and benefits of combination vaccines. What tests do I need? Blood tests  Lipid and cholesterol levels. These may be checked every 5 years, or more frequently if you are over 50 years old.  Hepatitis C test.  Hepatitis B test. Screening  Lung cancer screening. You may have this screening every year starting at age 55 if you have a 30-pack-year history of smoking and currently smoke or have quit within the past 15 years.  Colorectal cancer screening. All adults should have this screening starting at age 50 and continuing until age 75. Your health care provider may recommend screening at age 45 if you are at increased risk. You will have tests every 1-10 years, depending on your results and the type of screening test.  Diabetes screening. This is done by checking your blood sugar (glucose) after you have   not eaten for a while (fasting). You may have this done every 1-3 years.  Mammogram. This may be done every 1-2 years. Talk with your health care provider about when you should start having regular mammograms. This may depend on whether you have a family history of breast cancer.  BRCA-related cancer screening. This may be done if you have a family  history of breast, ovarian, tubal, or peritoneal cancers.  Pelvic exam and Pap test. This may be done every 3 years starting at age 21. Starting at age 30, this may be done every 5 years if you have a Pap test in combination with an HPV test. Other tests  Sexually transmitted disease (STD) testing.  Bone density scan. This is done to screen for osteoporosis. You may have this scan if you are at high risk for osteoporosis. Follow these instructions at home: Eating and drinking  Eat a diet that includes fresh fruits and vegetables, whole grains, lean protein, and low-fat dairy.  Take vitamin and mineral supplements as recommended by your health care provider.  Do not drink alcohol if: ? Your health care provider tells you not to drink. ? You are pregnant, may be pregnant, or are planning to become pregnant.  If you drink alcohol: ? Limit how much you have to 0-1 drink a day. ? Be aware of how much alcohol is in your drink. In the [place] one drink equals one 12 oz bottle of beer (355 mL), one 5 oz glass of wine (148 mL), or one 1 oz glass of hard liquor (44 mL). Lifestyle  Take daily care of your teeth and gums.  Stay active. Exercise for at least 30 minutes on 5 or more days each week.  Do not use any products that contain nicotine or tobacco, such as cigarettes, e-cigarettes, and chewing tobacco. If you need help quitting, ask your health care provider.  If you are sexually active, practice safe sex. Use a condom or other form of birth control (contraception) in order to prevent pregnancy and STIs (sexually transmitted infections).  If told by your health care provider, take low-dose aspirin daily starting at age 50. What's next?  Visit your health care provider once a year for a well check visit.  Ask your health care provider how often you should have your eyes and teeth checked.  Stay up to date on all vaccines. This information is not intended to replace advice given to you  by your health care provider. Make sure you discuss any questions you have with your health care provider. Document Released: 01/05/2016 Document Revised: 08/20/2018 Document Reviewed: 08/20/2018 Elsevier Patient Education  2020 Elsevier Inc.  

## 2019-12-11 LAB — VITAMIN D 1,25 DIHYDROXY
Vitamin D 1, 25 (OH)2 Total: 68 pg/mL (ref 18–72)
Vitamin D2 1, 25 (OH)2: <8 pg/mL
Vitamin D3 1, 25 (OH)2: 68 pg/mL

## 2019-12-12 ENCOUNTER — Telehealth: Payer: Self-pay | Admitting: Family

## 2019-12-12 NOTE — Telephone Encounter (Signed)
Please advise pt that her iron level is too high. Is she taking any supplements containing iron? If so, please stop.  Sugar and triglycerides are also mildly elevated. Please work on avoiding concentrated sweets, and limiting white carbs (rice/bread/pasta/potatoes). Instead substitute whole grain versions with reasonable portions.

## 2019-12-13 NOTE — Telephone Encounter (Signed)
Patient advised of all results and provider's advise. She reports she is taking Iron supplements, she will discontinue this and work on her diet.

## 2019-12-16 ENCOUNTER — Other Ambulatory Visit (HOSPITAL_BASED_OUTPATIENT_CLINIC_OR_DEPARTMENT_OTHER): Payer: PRIVATE HEALTH INSURANCE

## 2019-12-16 ENCOUNTER — Ambulatory Visit (HOSPITAL_BASED_OUTPATIENT_CLINIC_OR_DEPARTMENT_OTHER): Payer: PRIVATE HEALTH INSURANCE

## 2019-12-20 ENCOUNTER — Other Ambulatory Visit (HOSPITAL_BASED_OUTPATIENT_CLINIC_OR_DEPARTMENT_OTHER): Payer: PRIVATE HEALTH INSURANCE

## 2019-12-20 ENCOUNTER — Ambulatory Visit (HOSPITAL_BASED_OUTPATIENT_CLINIC_OR_DEPARTMENT_OTHER): Payer: PRIVATE HEALTH INSURANCE

## 2020-06-15 ENCOUNTER — Encounter: Payer: Self-pay | Admitting: Family

## 2020-06-15 DIAGNOSIS — E785 Hyperlipidemia, unspecified: Secondary | ICD-10-CM

## 2020-06-16 MED ORDER — ATORVASTATIN CALCIUM 20 MG PO TABS: 20.0000 mg | ORAL_TABLET | Freq: Every day | ORAL | 1 refills | Status: DC

## 2020-08-03 ENCOUNTER — Encounter: Payer: Self-pay | Admitting: Gastroenterology

## 2020-12-08 ENCOUNTER — Ambulatory Visit: Payer: PRIVATE HEALTH INSURANCE | Admitting: Family

## 2020-12-08 ENCOUNTER — Encounter: Payer: Self-pay | Admitting: Family

## 2020-12-08 ENCOUNTER — Other Ambulatory Visit: Payer: Self-pay

## 2020-12-08 VITALS — BP 116/82 | HR 95 | Resp 16 | Ht 63.0 in | Wt 180.0 lb

## 2020-12-08 DIAGNOSIS — Z0000 Encounter for general adult medical examination without abnormal findings: Secondary | ICD-10-CM

## 2020-12-08 DIAGNOSIS — R61 Generalized hyperhidrosis: Secondary | ICD-10-CM

## 2020-12-08 DIAGNOSIS — K219 Gastro-esophageal reflux disease without esophagitis: Secondary | ICD-10-CM | POA: Diagnosis not present

## 2020-12-08 DIAGNOSIS — Z2821 Immunization not carried out because of patient refusal: Secondary | ICD-10-CM

## 2020-12-08 DIAGNOSIS — E785 Hyperlipidemia, unspecified: Secondary | ICD-10-CM | POA: Diagnosis not present

## 2020-12-08 DIAGNOSIS — M8580 Other specified disorders of bone density and structure, unspecified site: Secondary | ICD-10-CM

## 2020-12-08 LAB — CBC WITH DIFFERENTIAL/PLATELET
Basophils Absolute: 0.1 10*3/uL (ref 0.0–0.1)
Basophils Relative: 1 % (ref 0.0–3.0)
Eosinophils Absolute: 0.1 10*3/uL (ref 0.0–0.7)
Eosinophils Relative: 1.3 % (ref 0.0–5.0)
HCT: 43.2 % (ref 36.0–46.0)
Hemoglobin: 14.3 g/dL (ref 12.0–15.0)
Lymphocytes Relative: 30.1 % (ref 12.0–46.0)
Lymphs Abs: 1.6 10*3/uL (ref 0.7–4.0)
MCHC: 33.2 g/dL (ref 30.0–36.0)
MCV: 95.3 fl (ref 78.0–100.0)
Monocytes Absolute: 0.5 10*3/uL (ref 0.1–1.0)
Monocytes Relative: 9.2 % (ref 3.0–12.0)
Neutro Abs: 3.1 10*3/uL (ref 1.4–7.7)
Neutrophils Relative %: 58.4 % (ref 43.0–77.0)
Platelets: 225 10*3/uL (ref 150.0–400.0)
RBC: 4.54 Mil/uL (ref 3.87–5.11)
RDW: 15.1 % (ref 11.5–15.5)
WBC: 5.3 10*3/uL (ref 4.0–10.5)

## 2020-12-08 LAB — COMPREHENSIVE METABOLIC PANEL
ALT: 12 U/L (ref 0–35)
AST: 17 U/L (ref 0–37)
Albumin: 4.4 g/dL (ref 3.5–5.2)
Alkaline Phosphatase: 75 U/L (ref 39–117)
BUN: 6 mg/dL (ref 6–23)
CO2: 28 mEq/L (ref 19–32)
Calcium: 9.8 mg/dL (ref 8.4–10.5)
Chloride: 104 mEq/L (ref 96–112)
Creatinine, Ser: 0.75 mg/dL (ref 0.40–1.20)
GFR: 84.91 mL/min (ref >60.00)
Glucose, Bld: 96 mg/dL (ref 70–99)
Potassium: 4.4 mEq/L (ref 3.5–5.1)
Sodium: 140 mEq/L (ref 135–145)
Total Bilirubin: 0.8 mg/dL (ref 0.2–1.2)
Total Protein: 7.2 g/dL (ref 6.0–8.3)

## 2020-12-08 LAB — LIPID PANEL
Cholesterol: 171 mg/dL (ref 0–200)
HDL: 62.5 mg/dL (ref >39.00)
LDL Cholesterol: 83 mg/dL (ref 0–99)
NonHDL: 108.22
Total CHOL/HDL Ratio: 3
Triglycerides: 127 mg/dL (ref 0.0–149.0)
VLDL: 25.4 mg/dL (ref 0.0–40.0)

## 2020-12-08 NOTE — Patient Instructions (Addendum)
Please schedule a routine eye exam.  Great work with the healthy diet, exercise and weight loss! Complete lab work prior to leaving.

## 2020-12-08 NOTE — Progress Notes (Signed)
Subjective:    Patient ID: Dawn Huber, female    DOB: 1956/07/04, 64 y.o.   MRN: [id number]  HPI  Patient presents today for complete physical.  Immunizations: 2014, flu 09/18/2020, declines shingrix, declines covid vacine Diet: healthy,  Wt Readings from Last 3 Encounters:  12/08/20 180 lb (81.6 kg)  12/08/19 206 lb (93.4 kg)  10/16/18 200 lb 9.6 oz (91 kg)  Exercise: walking Colonoscopy: due Dexa: screening Pap Smear: hysterectomy Mammogram: due Vision: due Dental: vision  reports night sweats- on and off since she had covid.    Hyperlipidemia-  Lab Results  Component Value Date   CHOL 179 12/08/2019   HDL 63.70 12/08/2019   LDLCALC 81 12/08/2019   LDLDIRECT 172.0 07/10/2015   TRIG 170.0 (H) 12/08/2019   CHOLHDL 3 12/08/2019   Maintained on atorvastatin 20mg .   GERD- maintained on protonix 40mg .   Reports symptoms well controlled.    Review of Systems  Constitutional: Negative for unexpected weight change.  HENT: Negative for hearing loss (congenital left ear deafness) and rhinorrhea.   Eyes: Negative for visual disturbance.  Respiratory: Negative for cough and shortness of breath.   Cardiovascular: Negative for leg swelling.  Gastrointestinal: Negative for diarrhea, nausea and vomiting.  Genitourinary: Negative for dysuria, frequency and hematuria.  Musculoskeletal: Negative for arthralgias and myalgias.  Skin: Negative for rash.  Neurological: Negative for headaches.  Hematological: Negative for adenopathy.  Psychiatric/Behavioral:       Denies depression/anxiety       Past Medical History:  Diagnosis Date  . Diverticulosis   . Genital warts   . GI bleed due to NSAIDs 03/2017   hospitalized at [facility]  . History of chicken pox   . Hypertension   . Osteopenia 02/18/2012  . Personal history of colonic polyps      Social History   Socioeconomic History  . Marital status: Married    Spouse name: Not on file  . Number of children: 2   . Years of education: Not on file  . Highest education level: Not on file  Occupational History  . Not on file  Tobacco Use  . Smoking status: Former Smoker    Quit date: 08/16/2013    Years since quitting: 7.3  . Smokeless tobacco: Never Used  . Tobacco comment: Pt states she "vapes".  Vaping Use  . Vaping Use: Every day  . Devices: 3mg  nicotine  Substance and Sexual Activity  . Alcohol use: No    Alcohol/week: 0.0 standard drinks  . Drug use: No  . Sexual activity: Never  Other Topics Concern  . Not on file  Social History Narrative   Regular exercise:  No, walks dog daily   Caffeine Use:  12oz pepsi daily   Works as a Haematologist.   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   Married          Social Determinants of Radio broadcast assistant Strain: Not on file  Food Insecurity: Not on file  Transportation Needs: Not on file  Physical Activity: Not on file  Stress: Not on file  Social Connections: Not on file  Intimate Partner Violence: Not on file    Past Surgical History:  Procedure Laterality Date  . ABDOMINAL HYSTERECTOMY  1999   partial  . TUBAL LIGATION      Family History  Problem Relation Age of Onset  . Lung cancer Mother   . Emphysema Father   .  Stroke Father   . Seizures Father   . Heart disease Father   . Hypertension Father   . Colon cancer Maternal Aunt 56  . Hypertension Brother   . Gout Son   . Osteoarthritis Daughter   . Varicose Veins Daughter   . Breast cancer Neg Hx     Allergies  Allergen Reactions  . Amoxicillin Other (See Comments)    Itching of hands and feet    Current Outpatient Medications on File Prior to Visit  Medication Sig Dispense Refill  . atorvastatin (LIPITOR) 20 MG tablet Take 1 tablet (20 mg total) by mouth daily. 90 tablet 1  . calcium carbonate (OS-CAL) 600 MG TABS tablet Take 600 mg by mouth 2 (two) times daily with a meal. Pt taking one tablet a day    .  diphenhydramine-acetaminophen (TYLENOL PM) 25-500 MG TABS tablet Take 2 tablets by mouth at bedtime.    . pantoprazole (PROTONIX) 40 MG tablet Take 1 tablet (40 mg total) by mouth daily. 90 tablet 3   No current facility-administered medications on file prior to visit.    BP 116/82 (BP Location: Right Arm, Patient Position: Sitting, Cuff Size: Normal)   Pulse 95   Resp 16   Ht 5\' 3"  (1.6 m)   Wt 180 lb (81.6 kg)   SpO2 97%   BMI 31.89 kg/m    Objective:   Physical Exam Physical Exam  Constitutional: She is oriented to person, place, and time. She appears well-developed and well-nourished. No distress.  HENT:  Head: Normocephalic and atraumatic.  Right Ear: Tympanic membrane and ear canal normal.  Left Ear: Tympanic membrane and ear canal normal.  Mouth/Throat:Not examined- pt wearing mask Eyes: Pupils are equal, round, and reactive to light. No scleral icterus.  Neck: Normal range of motion. No thyromegaly present.  Cardiovascular: Normal rate and regular rhythm.   No murmur heard. Pulmonary/Chest: Effort normal and breath sounds normal. No respiratory distress. He has no wheezes. She has no rales. She exhibits no tenderness.  Abdominal: Soft. Bowel sounds are normal. She exhibits no distension and no mass. There is no tenderness. There is no rebound and no guarding.  Musculoskeletal: She exhibits no edema.  Lymphadenopathy:    She has no cervical adenopathy.  Neurological: She is alert and oriented to person, place, and time. She has 3+ bilateral patellar reflexes. She exhibits normal muscle tone. Coordination normal.  Skin: Skin is warm and dry.  Psychiatric: She has a normal mood and affect. Her behavior is normal. Judgment and thought content normal.  Breast/pelvic:  deferred           Assessment & Plan:  Preventative care- she has lost a considerable amount of weight and I commended her on this.  Refer for colo/mammo. Flu shot up to date.   Declines covid vaccine  series- counseling provided regarding importance of vaccination.    GERD- stable,continue protonix 40mg  once daily.  Hyperlipidemia- obtain follow up lipid panel. Continue atorvastatin 20mg  once daily along with diet/exercise/weight loss.     Assessment & Plan:  Night sweats (post covid)- recommeded cxr- due to night sweats. She declines. Will check cbc.   This visit occurred during the SARS-CoV-2 public health emergency.  Safety protocols were in place, including screening questions prior to the visit, additional usage of staff PPE, and extensive cleaning of exam room while observing appropriate contact time as indicated for disinfecting solutions.

## 2020-12-10 ENCOUNTER — Other Ambulatory Visit: Payer: Self-pay | Admitting: Family

## 2020-12-23 HISTORY — PX: OTHER SURGICAL HISTORY: SHX169

## 2021-01-17 ENCOUNTER — Ambulatory Visit (AMBULATORY_SURGERY_CENTER): Payer: PRIVATE HEALTH INSURANCE

## 2021-01-17 ENCOUNTER — Other Ambulatory Visit: Payer: Self-pay

## 2021-01-17 VITALS — Ht 63.0 in | Wt 180.0 lb

## 2021-01-17 DIAGNOSIS — Z1211 Encounter for screening for malignant neoplasm of colon: Secondary | ICD-10-CM

## 2021-01-17 DIAGNOSIS — Z86010 Personal history of colonic polyps: Secondary | ICD-10-CM

## 2021-01-17 DIAGNOSIS — Z01818 Encounter for other preprocedural examination: Secondary | ICD-10-CM

## 2021-01-17 MED ORDER — PLENVU 140 G PO SOLR: 1.0000 | ORAL | 0 refills | Status: DC

## 2021-01-17 NOTE — Progress Notes (Signed)
Pt verified name, DOB, address and insurance during PV today.   Pt mailed instruction packet to included paper to complete and mail back to [facility] with addressed and stamped envelope, Emmi video, copy of consent form to read and not return, and instructions. [employer] coupon mailed in packet. PV completed over the phone. Pt encouraged to call with questions or issues   No allergies to soy or egg Pt is not on blood thinners or diet pills Denies issues with sedation/intubation Denies atrial flutter/fib Denies constipation   Emmi instructions given to pt  Pt is aware of Covid safety and care partner requirements.   Self report wt:  180 lb-2 wks ago

## 2021-01-29 ENCOUNTER — Other Ambulatory Visit: Payer: Self-pay | Admitting: Gastroenterology

## 2021-01-29 LAB — SARS CORONAVIRUS 2 (TAT 6-24 HRS): SARS Coronavirus 2: NEGATIVE

## 2021-01-31 ENCOUNTER — Encounter: Payer: Self-pay | Admitting: Gastroenterology

## 2021-01-31 ENCOUNTER — Other Ambulatory Visit: Payer: Self-pay

## 2021-01-31 ENCOUNTER — Ambulatory Visit (AMBULATORY_SURGERY_CENTER): Payer: PRIVATE HEALTH INSURANCE | Admitting: Gastroenterology

## 2021-01-31 VITALS — BP 118/52 | HR 60 | Temp 96.8°F | Resp 12 | Ht 63.0 in | Wt 180.0 lb

## 2021-01-31 DIAGNOSIS — Z86010 Personal history of colonic polyps: Secondary | ICD-10-CM

## 2021-01-31 DIAGNOSIS — D124 Benign neoplasm of descending colon: Secondary | ICD-10-CM

## 2021-01-31 DIAGNOSIS — D123 Benign neoplasm of transverse colon: Secondary | ICD-10-CM | POA: Diagnosis not present

## 2021-01-31 DIAGNOSIS — D125 Benign neoplasm of sigmoid colon: Secondary | ICD-10-CM | POA: Diagnosis not present

## 2021-01-31 DIAGNOSIS — D128 Benign neoplasm of rectum: Secondary | ICD-10-CM | POA: Diagnosis not present

## 2021-01-31 MED ORDER — SODIUM CHLORIDE 0.9 % IV SOLN: 500.0000 mL | Freq: Once | INTRAVENOUS | Status: DC

## 2021-01-31 NOTE — Progress Notes (Signed)
VS by CW  I have reviewed the patient's medical history in detail and updated the computerized patient record.  

## 2021-01-31 NOTE — Progress Notes (Signed)
Called to room to assist during endoscopic procedure.  Patient ID and intended procedure confirmed with present staff. Received instructions for my participation in the procedure from the performing physician.  

## 2021-01-31 NOTE — Op Note (Signed)
[place] Patient Name: Dawn Huber Procedure Date: 01/31/2021 9:18 AM MRN: [id number] Endoscopist: Ladene Artist , MD Age: 65 Referring MD:  Date of Birth: 04-07-1956 Gender: Female Account #: [id number] Procedure:                Colonoscopy Indications:              Surveillance: Personal history of adenomatous                            polyps on last colonoscopy 5 years ago Medicines:                Monitored Anesthesia Care Procedure:                Pre-Anesthesia Assessment:                           - Prior to the procedure, a History and Physical                            was performed, and patient medications and                            allergies were reviewed. The patient's tolerance of                            previous anesthesia was also reviewed. The risks                            and benefits of the procedure and the sedation                            options and risks were discussed with the patient.                            All questions were answered, and informed consent                            was obtained. Prior Anticoagulants: The patient has                            taken no previous anticoagulant or antiplatelet                            agents. ASA Grade Assessment: II - A patient with                            mild systemic disease. After reviewing the risks                            and benefits, the patient was deemed in                            satisfactory condition to undergo the procedure.  After obtaining informed consent, the colonoscope                            was passed under direct vision. Throughout the                            procedure, the patient's blood pressure, pulse, and                            oxygen saturations were monitored continuously. The                            Olympus CF-HQ190L (Serial# [id number]) Colonoscope was                            introduced through the  anus and advanced to the the                            cecum, identified by appendiceal orifice and                            ileocecal valve. The ileocecal valve, appendiceal                            orifice, and rectum were photographed. The quality                            of the bowel preparation was good. The colonoscopy                            was performed without difficulty. The patient                            tolerated the procedure well. Scope In: 9:26:36 AM Scope Out: 9:49:49 AM Scope Withdrawal Time: 0 hours 20 minutes 6 seconds  Total Procedure Duration: 0 hours 23 minutes 13 seconds  Findings:                 The perianal and digital rectal examinations were                            normal.                           A 5 mm polyp was found in the sigmoid colon. The                            polyp was sessile. The polyp was removed with a                            cold biopsy forceps. Resection and retrieval were                            complete.  Seven sessile polyps were found in the rectum (1),                            descending colon (4) and transverse colon (2). The                            polyps were 7 to 8 mm in size. These polyps were                            removed with a cold snare. Resection and retrieval                            were complete.                           Multiple medium-mouthed diverticula were found in                            the left colon. There was no evidence of                            diverticular bleeding.                           Internal hemorrhoids were found during                            retroflexion. The hemorrhoids were moderate and                            Grade I (internal hemorrhoids that do not prolapse).                           The exam was otherwise without abnormality on                            direct and retroflexion views. Complications:            No  immediate complications. Estimated blood loss:                            None. Estimated Blood Loss:     Estimated blood loss: none. Impression:               - One 5 mm polyp in the sigmoid colon, removed with                            a cold biopsy forceps. Resected and retrieved.                           - Seven 7 to 8 mm polyps in the rectum, in the                            descending colon and in the transverse colon,  removed with a cold snare. Resected and retrieved.                           - Moderate diverticulosis in the left colon.                           - Internal hemorrhoids.                           - The examination was otherwise normal on direct                            and retroflexion views. Recommendation:           - Repeat colonoscopy date to be determined, likely                            3 years, after pending pathology results are                            reviewed for surveillance based on pathology                            results.                           - Patient has a contact number available for                            emergencies. The signs and symptoms of potential                            delayed complications were discussed with the                            patient. Return to normal activities tomorrow.                            Written discharge instructions were provided to the                            patient.                           - Resume previous diet.                           - Continue present medications.                           - Await pathology results. Ladene Artist, MD 01/31/2021 9:59:14 AM This report has been signed electronically.

## 2021-01-31 NOTE — Progress Notes (Signed)
To PACU, VSS. Report to [email or web address] 

## 2021-01-31 NOTE — Patient Instructions (Signed)
Handout on polyps, diverticulosis and hemorrhoids given. ? ?YOU HAD AN ENDOSCOPIC PROCEDURE TODAY AT THE [facility]:   Refer to the procedure report that was given to you for any specific questions about what was found during the examination.  If the procedure report does not answer your questions, please call your gastroenterologist to clarify.  If you requested that your care partner not be given the details of your procedure findings, then the procedure report has been included in a sealed envelope for you to review at your convenience later. ? ?YOU SHOULD EXPECT: Some feelings of bloating in the abdomen. Passage of more gas than usual.  Walking can help get rid of the air that was put into your GI tract during the procedure and reduce the bloating. If you had a lower endoscopy (such as a colonoscopy or flexible sigmoidoscopy) you may notice spotting of blood in your stool or on the toilet paper. If you underwent a bowel prep for your procedure, you may not have a normal bowel movement for a few days. ? ?Please Note:  You might notice some irritation and congestion in your nose or some drainage.  This is from the oxygen used during your procedure.  There is no need for concern and it should clear up in a day or so. ? ?SYMPTOMS TO REPORT IMMEDIATELY: ? ?Following lower endoscopy (colonoscopy or flexible sigmoidoscopy): ? Excessive amounts of blood in the stool ? Significant tenderness or worsening of abdominal pains ? Swelling of the abdomen that is new, acute ? Fever of 100?F or higher ? ? ?For urgent or emergent issues, a gastroenterologist can be reached at any hour by calling [phone]. ?Do not use MyChart messaging for urgent concerns.  ? ? ?DIET:  We do recommend a small meal at first, but then you may proceed to your regular diet.  Drink plenty of fluids but you should avoid alcoholic beverages for 24 hours. ? ?ACTIVITY:  You should plan to take it easy for the rest of today and you  should NOT DRIVE or use heavy machinery until tomorrow (because of the sedation medicines used during the test).   ? ?FOLLOW UP: ?Our staff will call the number listed on your records 48-72 hours following your procedure to check on you and address any questions or concerns that you may have regarding the information given to you following your procedure. If we do not reach you, we will leave a message.  We will attempt to reach you two times.  During this call, we will ask if you have developed any symptoms of COVID 19. If you develop any symptoms (ie: fever, flu-like symptoms, shortness of breath, cough etc.) before then, please call [phone].  If you test positive for Covid 19 in the 2 weeks post procedure, please call and report this information to us.   ? ?If any biopsies were taken you will be contacted by phone or by letter within the next 1-3 weeks.  Please call us at [phone] if you have not heard about the biopsies in 3 weeks.  ? ? ?SIGNATURES/CONFIDENTIALITY: ?You and/or your care partner have signed paperwork which will be entered into your electronic medical record.  These signatures attest to the fact that that the information above on your After Visit Summary has been reviewed and is understood.  Full responsibility of the confidentiality of this discharge information lies with you and/or your care-partner.  ?

## 2021-02-02 ENCOUNTER — Telehealth: Payer: Self-pay

## 2021-02-02 NOTE — Telephone Encounter (Signed)
  Follow up Call-  Call back number 01/31/2021  Post procedure Call Back phone  # [phone]  Permission to leave phone message Yes  Some recent data might be hidden     Patient questions:  Do you have a fever, pain , or abdominal swelling? No. Pain Score  0 *  Have you tolerated food without any problems? Yes.    Have you been able to return to your normal activities? Yes.    Do you have any questions about your discharge instructions: Diet   No. Medications  No. Follow up visit  No.  Do you have questions or concerns about your Care? No.  Actions: * If pain score is 4 or above: No action needed, pain <4. 1. Have you developed a fever since your procedure? no  2.   Have you had an respiratory symptoms (SOB or cough) since your procedure? no  3.   Have you tested positive for COVID 19 since your procedure no  4.   Have you had any family members/close contacts diagnosed with the COVID 19 since your procedure?  no   If yes to any of these questions please route to Joylene John, RN and Joella Prince, RN

## 2021-02-22 ENCOUNTER — Encounter: Payer: Self-pay | Admitting: Gastroenterology

## 2021-11-13 ENCOUNTER — Other Ambulatory Visit: Payer: Self-pay | Admitting: Family

## 2021-12-11 ENCOUNTER — Ambulatory Visit (INDEPENDENT_AMBULATORY_CARE_PROVIDER_SITE_OTHER): Payer: PRIVATE HEALTH INSURANCE | Admitting: Family

## 2021-12-11 ENCOUNTER — Encounter: Payer: Self-pay | Admitting: Family

## 2021-12-11 VITALS — BP 104/62 | HR 78 | Temp 97.9°F | Ht 62.0 in | Wt 178.8 lb

## 2021-12-11 DIAGNOSIS — E785 Hyperlipidemia, unspecified: Secondary | ICD-10-CM | POA: Diagnosis not present

## 2021-12-11 DIAGNOSIS — E559 Vitamin D deficiency, unspecified: Secondary | ICD-10-CM

## 2021-12-11 DIAGNOSIS — Z0000 Encounter for general adult medical examination without abnormal findings: Secondary | ICD-10-CM | POA: Diagnosis not present

## 2021-12-11 DIAGNOSIS — D509 Iron deficiency anemia, unspecified: Secondary | ICD-10-CM

## 2021-12-11 DIAGNOSIS — E0590 Thyrotoxicosis, unspecified without thyrotoxic crisis or storm: Secondary | ICD-10-CM | POA: Diagnosis not present

## 2021-12-11 DIAGNOSIS — K219 Gastro-esophageal reflux disease without esophagitis: Secondary | ICD-10-CM

## 2021-12-11 LAB — CBC WITH DIFFERENTIAL/PLATELET
Basophils Absolute: 0 10*3/uL (ref 0.0–0.1)
Basophils Relative: 0.9 % (ref 0.0–3.0)
Eosinophils Absolute: 0 10*3/uL (ref 0.0–0.7)
Eosinophils Relative: 0.6 % (ref 0.0–5.0)
HCT: 40.1 % (ref 36.0–46.0)
Hemoglobin: 13.4 g/dL (ref 12.0–15.0)
Lymphocytes Relative: 29.9 % (ref 12.0–46.0)
Lymphs Abs: 1.6 10*3/uL (ref 0.7–4.0)
MCHC: 33.5 g/dL (ref 30.0–36.0)
MCV: 95.3 fl (ref 78.0–100.0)
Monocytes Absolute: 0.4 10*3/uL (ref 0.1–1.0)
Monocytes Relative: 7.7 % (ref 3.0–12.0)
Neutro Abs: 3.3 10*3/uL (ref 1.4–7.7)
Neutrophils Relative %: 60.9 % (ref 43.0–77.0)
Platelets: 198 10*3/uL (ref 150.0–400.0)
RBC: 4.2 Mil/uL (ref 3.87–5.11)
RDW: 13.2 % (ref 11.5–15.5)
WBC: 5.5 10*3/uL (ref 4.0–10.5)

## 2021-12-11 LAB — COMPREHENSIVE METABOLIC PANEL
ALT: 10 U/L (ref 0–35)
AST: 15 U/L (ref 0–37)
Albumin: 4.3 g/dL (ref 3.5–5.2)
Alkaline Phosphatase: 67 U/L (ref 39–117)
BUN: 9 mg/dL (ref 6–23)
CO2: 29 mEq/L (ref 19–32)
Calcium: 9.8 mg/dL (ref 8.4–10.5)
Chloride: 104 mEq/L (ref 96–112)
Creatinine, Ser: 0.66 mg/dL (ref 0.40–1.20)
GFR: 92.9 mL/min (ref >60.00)
Glucose, Bld: 97 mg/dL (ref 70–99)
Potassium: 4.2 mEq/L (ref 3.5–5.1)
Sodium: 141 mEq/L (ref 135–145)
Total Bilirubin: 0.8 mg/dL (ref 0.2–1.2)
Total Protein: 6.8 g/dL (ref 6.0–8.3)

## 2021-12-11 LAB — LIPID PANEL
Cholesterol: 160 mg/dL (ref 0–200)
HDL: 60.7 mg/dL (ref >39.00)
LDL Cholesterol: 78 mg/dL (ref 0–99)
NonHDL: 98.82
Total CHOL/HDL Ratio: 3
Triglycerides: 105 mg/dL (ref 0.0–149.0)
VLDL: 21 mg/dL (ref 0.0–40.0)

## 2021-12-11 LAB — T4, FREE: Free T4: 0.75 ng/dL (ref 0.60–1.60)

## 2021-12-11 LAB — TSH: TSH: 0.92 u[IU]/mL (ref 0.35–5.50)

## 2021-12-11 LAB — T3, FREE: T3, Free: 3.4 pg/mL (ref 2.3–4.2)

## 2021-12-11 LAB — VITAMIN D 25 HYDROXY (VIT D DEFICIENCY, FRACTURES): VITD: 32.26 ng/mL (ref 30.00–100.00)

## 2021-12-11 MED ORDER — PANTOPRAZOLE SODIUM 40 MG PO TBEC: 40.0000 mg | DELAYED_RELEASE_TABLET | Freq: Every day | ORAL | 0 refills | Status: DC

## 2021-12-11 NOTE — Progress Notes (Signed)
Subjective:   By signing my name below, I, Lyric Barr-McArthur, attest that this documentation has been prepared under the direction and in the presence of Debbrah Alar, NP, 12/11/2021    Patient ID: Dawn Huber, female    DOB: 02-10-1956, 65 y.o.   MRN: [id number]  Chief Complaint  Patient presents with   Annual Exam    CPE-Fasting     HPI Patient is in today for a comprehensive physical exam.  She denies any fever, unexpected weight change, adenopathy, rash, hearing loss, ear pain, rhinorrhea, visual disturbances, eye pain, chest pain, leg swelling, cough, nausea, vomitting, diarrhea, blood in stool, frequency, myalgias, arthralgias, depression or anxiety.  Urinary history:  She notes that she has a history of UTIs. She does not currently have any symptoms but sometimes she will feel like she is about to get one and does routine things like drinking cranberry juice. She is not concerned about this as it has always been prevalent in her life.  Medicare: She is waiting to have a mammogram and bone density test performed until her medicare begins in November of 2023 due to her recent retirement. She notes this is a financial decision at this time.  Immunizations: She has already received her flu shot this season. She is interested in getting the Shingrix vaccine at a later visit in the office. She is UTD on tetanus until 2024.  Diet: She denies maintaining a healthy diet. Exercise: She mentions that she does not exercise regularly and plans to exercise more when it gets warmer outside.  Colonoscopy: Last performed on 01/31/2021 and results showed a 5 mm polyp in the sigmoid colon and seven, 7-8 mm sessile polyps in the rectum (1), descending colon (4), and transverse colon (2). There were multiple medium-mouthed diverticula found in the left colon and internal hemorrhoids.  Dexa: Last performed on 02/18/2012 and results showed that she was osteopenic. Repeat every 2 years. Due.  Pap  Smear: Last performed on 12/23/2004 and results were normal. She has had a hysterectomy so no further pap smears are necessary.  Mammogram: Last performed on 11/10/2017 and results were normal. Repeat after one year. Due. An order was placed for a routine mammogram and the order has now expired.  Alcohol: She does not drink alcohol at this time.  Drugs: She does not use any drugs at this time.  Tobacco: She currently is using vaping products. She mentions she does not sit around using it all day but she is currently still using it.  Vision: She is UTD on vision care. She notes she just had cataracts surgery.  Dental: She is UTD on dental care.  SHx: She had cataracts surgery on her left eye in September 2022 and the right eye in October 2022.  FMHx: She denies any changes to her family medical history at this time.   Health Maintenance Due  Topic Date Due   Pneumococcal Vaccine 55-91 Years old (1 - PCV) Never done   URINE MICROALBUMIN  Never done   Zoster Vaccines- Shingrix (1 of 2) Never done   MAMMOGRAM  11/10/2018    Past Medical History:  Diagnosis Date   Arthritis    Blood transfusion without reported diagnosis 2018   Diverticulosis    Genital warts    GERD (gastroesophageal reflux disease)    GI bleed due to NSAIDs 03/2017   hospitalized at [facility]   History of chicken pox    Hyperlipidemia    Osteopenia 02/18/2012  Personal history of colonic polyps     Past Surgical History:  Procedure Laterality Date   ABDOMINAL HYSTERECTOMY  1999   partial   cataract surgery Bilateral 2022   september and october   COLONOSCOPY  2016   TUBAL LIGATION      Family History  Problem Relation Age of Onset   Lung cancer Mother    Emphysema Father    Stroke Father    Seizures Father    Heart disease Father    Hypertension Father    Colon cancer Maternal Aunt 80   Hypertension Brother    Gout Son    Osteoarthritis Daughter    Varicose Veins Daughter    Breast cancer Neg Hx     Colon polyps Neg Hx    Esophageal cancer Neg Hx    Rectal cancer Neg Hx    Stomach cancer Neg Hx     Social History   Socioeconomic History   Marital status: Married    Spouse name: Not on file   Number of children: 2   Years of education: Not on file   Highest education level: Not on file  Occupational History   Not on file  Tobacco Use   Smoking status: Former    Types: Cigarettes    Quit date: 08/16/2013    Years since quitting: 8.3   Smokeless tobacco: Never   Tobacco comments:    Pt states she "vapes".  Vaping Use   Vaping Use: Some days   Devices: 81m nicotine  Substance and Sexual Activity   Alcohol use: No    Alcohol/week: 0.0 standard drinks   Drug use: No   Sexual activity: Not Currently  Other Topics Concern   Not on file  Social History Narrative   Regular exercise:  No, walks dog daily   Caffeine Use:  12oz pepsi daily   Works as a hHaematologist   She has 2 grown children and 4 grandchildren- family lives locally.   Married          Social Determinants of HRadio broadcast assistantStrain: Not on file  Food Insecurity: Not on file  Transportation Needs: Not on file  Physical Activity: Not on file  Stress: Not on file  Social Connections: Not on file  Intimate Partner Violence: Not on file    Outpatient Medications Prior to Visit  Medication Sig Dispense Refill   Ascorbic Acid (VITAMIN C PO) Take by mouth.     atorvastatin (LIPITOR) 20 MG tablet Take 1 tablet by mouth once daily 90 tablet 1   calcium carbonate (OS-CAL) 600 MG TABS tablet Take 600 mg by mouth 2 (two) times daily with a meal. Pt taking one tablet a day     calcium-vitamin D (OSCAL WITH D) 500-200 MG-UNIT tablet Take 1 tablet by mouth.     diphenhydramine-acetaminophen (TYLENOL PM) 25-500 MG TABS tablet Take 2 tablets by mouth at bedtime.     vitamin E 1000 UNIT capsule Take 1,000 Units by mouth daily.     pantoprazole (PROTONIX) 40 MG tablet Take 1 tablet by mouth once daily  90 tablet 0   No facility-administered medications prior to visit.    Allergies  Allergen Reactions   Amoxicillin Other (See Comments)    Itching of hands and feet    Review of Systems  Constitutional:  Negative for fever.       (-) unexpected weight changes (-) adenopathy  HENT:  Negative for ear pain and hearing  loss.        (-) rhinorrhea  Eyes:  Negative for pain.       (-) visual disturbances  Respiratory:  Negative for cough.   Cardiovascular:  Negative for chest pain and leg swelling.  Gastrointestinal:  Negative for blood in stool, diarrhea, nausea and vomiting.  Genitourinary:  Positive for dysuria (comes and goes). Negative for frequency.  Musculoskeletal:  Negative for joint pain and myalgias.  Skin:  Negative for rash.  Neurological:  Positive for headaches (tension headaches).  Psychiatric/Behavioral:  Negative for depression. The patient is not nervous/anxious.       Objective:    Physical Exam Constitutional:      General: She is not in acute distress.    Appearance: Normal appearance. She is not ill-appearing.  HENT:     Head: Normocephalic and atraumatic.     Right Ear: Tympanic membrane, ear canal and external ear normal.     Left Ear: Tympanic membrane, ear canal and external ear normal.  Eyes:     Extraocular Movements: Extraocular movements intact.     Pupils: Pupils are equal, round, and reactive to light.     Comments: (-) nystagmus  Cardiovascular:     Rate and Rhythm: Normal rate and regular rhythm.     Heart sounds: Normal heart sounds. No murmur heard.   No gallop.  Pulmonary:     Effort: Pulmonary effort is normal. No respiratory distress.     Breath sounds: Normal breath sounds. No wheezing or rales.  Abdominal:     General: Bowel sounds are normal.     Palpations: Abdomen is soft.     Tenderness: There is no abdominal tenderness.  Musculoskeletal:     Comments: (+) 5/5 upper and lower extremity strength  Skin:    General: Skin is  warm and dry.  Neurological:     Mental Status: She is alert and oriented to person, place, and time.     Deep Tendon Reflexes:     Reflex Scores:      Patellar reflexes are 3+ on the right side and 3+ on the left side. Psychiatric:        Behavior: Behavior normal.        Judgment: Judgment normal.    BP 104/62    Pulse 78    Temp 97.9 F (36.6 C) (Oral)    Ht 5' 2"  (1.575 m)    Wt 178 lb 12.8 oz (81.1 kg)    SpO2 98%    BMI 32.70 kg/m  Wt Readings from Last 3 Encounters:  12/11/21 178 lb 12.8 oz (81.1 kg)  01/31/21 180 lb (81.6 kg)  01/17/21 180 lb (81.6 kg)       Assessment & Plan:   Problem List Items Addressed This Visit       Unprioritized   Vitamin D deficiency   Relevant Orders   Vitamin D (25 hydroxy)   Subclinical hyperthyroidism   Relevant Orders   TSH   T3, free   T4, free   Preventative health care - Primary    Wt Readings from Last 3 Encounters:  12/11/21 178 lb 12.8 oz (81.1 kg)  01/31/21 180 lb (81.6 kg)  01/17/21 180 lb (81.6 kg)  Discussed healthy diet, exercise and weight loss. She is agreeable to to mammogram, but wishes to wait on the dexa due to cost reasons. Colo up to date. Wishes to wait on shingrix.  S/p hysterectomy so does not need pap. Counseled on  quitting Vaping.       Relevant Orders   MM 3D SCREEN BREAST BILATERAL   IDA (iron deficiency anemia)   Relevant Orders   CBC with Differential/Platelet   Hyperlipidemia   Relevant Orders   Lipid panel   Comp Met (CMET)   GERD (gastroesophageal reflux disease)    Stable on protonix.  Continue same.       Relevant Medications   pantoprazole (PROTONIX) 40 MG tablet   Meds ordered this encounter  Medications   pantoprazole (PROTONIX) 40 MG tablet    Sig: Take 1 tablet (40 mg total) by mouth daily.    Dispense:  90 tablet    Refill:  0    Order Specific Question:   Supervising Provider    Answer:   Penni Homans A [[id number]]    I, Debbrah Alar, NP, personally preformed the  services described in this documentation.  All medical record entries made by the scribe were at my direction and in my presence.  I have reviewed the chart and discharge instructions (if applicable) and agree that the record reflects my personal performance and is accurate and complete. 12/11/2021  I,Lyric Barr-McArthur,acting as a Education administrator for Nance Pear, NP.,have documented all relevant documentation on the behalf of Nance Pear, NP,as directed by  Nance Pear, NP while in the presence of Nance Pear, NP.  Nance Pear, NP

## 2021-12-11 NOTE — Assessment & Plan Note (Addendum)
Wt Readings from Last 3 Encounters:  12/11/21 178 lb 12.8 oz (81.1 kg)  01/31/21 180 lb (81.6 kg)  01/17/21 180 lb (81.6 kg)   Discussed healthy diet, exercise and weight loss. She is agreeable to to mammogram, but wishes to wait on the dexa due to cost reasons. Colo up to date. Wishes to wait on shingrix.  S/p hysterectomy so does not need pap. Counseled on quitting Vaping.

## 2021-12-11 NOTE — Patient Instructions (Addendum)
Please visit the lab before leaving today to have updated lab work performed.  Please schedule mammogram at your earliest convenience.  Try to add 30 minutes of exercise 5 times a week. Work on quitting vaping.

## 2021-12-11 NOTE — Assessment & Plan Note (Signed)
Stable on protonix.  Continue same.  

## 2021-12-11 NOTE — Assessment & Plan Note (Signed)
Tolerating lipitor, obtain follow up lipid panel.  Continue lipitor.

## 2021-12-12 ENCOUNTER — Telehealth (HOSPITAL_BASED_OUTPATIENT_CLINIC_OR_DEPARTMENT_OTHER): Payer: Self-pay

## 2021-12-13 ENCOUNTER — Telehealth: Payer: Self-pay | Admitting: *Deleted

## 2021-12-13 ENCOUNTER — Other Ambulatory Visit: Payer: Self-pay | Admitting: Family

## 2021-12-13 NOTE — Telephone Encounter (Signed)
error 

## 2022-04-14 ENCOUNTER — Other Ambulatory Visit: Payer: Self-pay | Admitting: Family

## 2022-07-15 ENCOUNTER — Encounter (HOSPITAL_BASED_OUTPATIENT_CLINIC_OR_DEPARTMENT_OTHER): Payer: Self-pay

## 2022-07-15 ENCOUNTER — Emergency Department (HOSPITAL_BASED_OUTPATIENT_CLINIC_OR_DEPARTMENT_OTHER)
Admission: EM | Admit: 2022-07-15 | Discharge: 2022-07-15 | Disposition: A | Discharge status: Home / Self Care | Payer: Commercial Managed Care - PPO | Attending: Emergency Medicine | Admitting: Emergency Medicine

## 2022-07-15 ENCOUNTER — Emergency Department (HOSPITAL_BASED_OUTPATIENT_CLINIC_OR_DEPARTMENT_OTHER): Payer: Commercial Managed Care - PPO

## 2022-07-15 ENCOUNTER — Other Ambulatory Visit: Payer: Self-pay

## 2022-07-15 DIAGNOSIS — R300 Dysuria: Secondary | ICD-10-CM | POA: Insufficient documentation

## 2022-07-15 DIAGNOSIS — E871 Hypo-osmolality and hyponatremia: Secondary | ICD-10-CM | POA: Diagnosis not present

## 2022-07-15 DIAGNOSIS — K5790 Diverticulosis of intestine, part unspecified, without perforation or abscess without bleeding: Secondary | ICD-10-CM | POA: Diagnosis not present

## 2022-07-15 DIAGNOSIS — R739 Hyperglycemia, unspecified: Secondary | ICD-10-CM | POA: Insufficient documentation

## 2022-07-15 LAB — URINALYSIS, ROUTINE W REFLEX MICROSCOPIC

## 2022-07-15 LAB — BASIC METABOLIC PANEL
Anion gap: 10 (ref 5–15)
BUN: 6 mg/dL — ABNORMAL LOW (ref 8–23)
CO2: 25 mmol/L (ref 22–32)
Calcium: 9.7 mg/dL (ref 8.9–10.3)
Chloride: 98 mmol/L (ref 98–111)
Creatinine, Ser: 0.8 mg/dL (ref 0.44–1.00)
GFR, Estimated: >60 mL/min (ref >60)
Glucose, Bld: 178 mg/dL — ABNORMAL HIGH (ref 70–99)
Potassium: 3.6 mmol/L (ref 3.5–5.1)
Sodium: 133 mmol/L — ABNORMAL LOW (ref 135–145)

## 2022-07-15 LAB — CBC WITH DIFFERENTIAL/PLATELET
Abs Immature Granulocytes: 0.02 10*3/uL (ref 0.00–0.07)
Basophils Absolute: 0 10*3/uL (ref 0.0–0.1)
Basophils Relative: 0 %
Eosinophils Absolute: 0.1 10*3/uL (ref 0.0–0.5)
Eosinophils Relative: 1 %
HCT: 42.9 % (ref 36.0–46.0)
Hemoglobin: 14.5 g/dL (ref 12.0–15.0)
Immature Granulocytes: 0 %
Lymphocytes Relative: 25 %
Lymphs Abs: 1.9 10*3/uL (ref 0.7–4.0)
MCH: 31.9 pg (ref 26.0–34.0)
MCHC: 33.8 g/dL (ref 30.0–36.0)
MCV: 94.3 fL (ref 80.0–100.0)
Monocytes Absolute: 0.9 10*3/uL (ref 0.1–1.0)
Monocytes Relative: 12 %
Neutro Abs: 4.8 10*3/uL (ref 1.7–7.7)
Neutrophils Relative %: 62 %
Platelets: 264 10*3/uL (ref 150–400)
RBC: 4.55 MIL/uL (ref 3.87–5.11)
RDW: 12.4 % (ref 11.5–15.5)
WBC: 7.7 10*3/uL (ref 4.0–10.5)
nRBC: 0 % (ref 0.0–0.2)

## 2022-07-15 LAB — URINALYSIS, MICROSCOPIC (REFLEX)

## 2022-07-15 MED ORDER — SODIUM CHLORIDE 0.9 % IV BOLUS
1000.0000 mL | Freq: Once | INTRAVENOUS | Status: AC
  Administered 2022-07-15: 1000 mL via INTRAVENOUS

## 2022-07-15 MED ORDER — FENTANYL CITRATE PF 50 MCG/ML IJ SOSY
50.0000 ug | PREFILLED_SYRINGE | Freq: Once | INTRAMUSCULAR | Status: AC
  Administered 2022-07-15: 50 ug via INTRAVENOUS
  Filled 2022-07-15: qty 1

## 2022-07-15 MED ORDER — KETOROLAC TROMETHAMINE 15 MG/ML IJ SOLN
15.0000 mg | Freq: Once | INTRAMUSCULAR | Status: AC
  Administered 2022-07-15: 15 mg via INTRAVENOUS
  Filled 2022-07-15: qty 1

## 2022-07-15 MED ORDER — ONDANSETRON HCL 4 MG/2ML IJ SOLN
4.0000 mg | Freq: Once | INTRAMUSCULAR | Status: AC
  Administered 2022-07-15: 4 mg via INTRAVENOUS
  Filled 2022-07-15: qty 2

## 2022-07-15 MED ORDER — CEPHALEXIN 250 MG PO CAPS
500.0000 mg | ORAL_CAPSULE | Freq: Once | ORAL | Status: AC
  Administered 2022-07-15: 500 mg via ORAL
  Filled 2022-07-15: qty 2

## 2022-07-15 MED ORDER — CEPHALEXIN 500 MG PO CAPS
500.0000 mg | ORAL_CAPSULE | Freq: Three times a day (TID) | ORAL | 0 refills | Status: AC
  Filled 2022-07-15: qty 21, 7d supply, fill #0

## 2022-07-15 NOTE — ED Triage Notes (Signed)
[facility], felt she was getting a UTI. Took 3 days of Bactrim. States some relief from burning. C/o pain in abdomen, worse with urination. Tearful during triage.

## 2022-07-15 NOTE — Discharge Instructions (Signed)
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

## 2022-07-15 NOTE — ED Provider Notes (Signed)
[facility] Provider Note   CSN: [id number] Arrival date & time: 07/15/22  1322     History  Chief Complaint  Patient presents with   Dysuria    Dawn Huber is a 66 y.o. female with medical history significant for GI bleed due to NSAID use, osteopenia, arthritis, diverticulosis, GERD, hyperlipidemia.  Patient presents ED for evaluation of painful urination.  The patient states that beginning on Wednesday she started having mild symptoms to include dribbling urine, feelings like she had to urinate excessively, decreased urine stream.  The patient states that the symptoms improved throughout the day.  Patient states that on Thursday her symptoms returned and worsened.  The patient states that her burning with urination increased as well as the development of right-sided flank pain.  The patient states that [facility] on Thursday and was diagnosed with UTI, placed on 3 days of Bactrim.  Patient states that she took full course of Bactrim which did mildly improve her burning however patient reports that symptoms continued.  The patient denies a history of kidney stones.  Patient denies any fevers at home, vomiting, body aches or chills, abdominal pain or diarrhea.    Dysuria Associated symptoms: nausea   Associated symptoms: no abdominal pain, no fever and no vomiting        Home Medications Prior to Admission medications   Medication Sig Start Date End Date Taking? Authorizing Provider  Ascorbic Acid (VITAMIN C PO) Take by mouth.    [provider]  atorvastatin (LIPITOR) 20 MG tablet Take 1 tablet by mouth once daily 11/13/21   Debbrah Alar, NP  calcium carbonate (OS-CAL) 600 MG TABS tablet Take 600 mg by mouth 2 (two) times daily with a meal. Pt taking one tablet a day    [provider]  calcium-vitamin D (OSCAL WITH D) 500-200 MG-UNIT tablet Take 1 tablet by mouth.    [provider]   diphenhydramine-acetaminophen (TYLENOL PM) 25-500 MG TABS tablet Take 2 tablets by mouth at bedtime.    [provider]  pantoprazole (PROTONIX) 40 MG tablet Take 1 tablet by mouth once daily 04/15/22   Debbrah Alar, NP  vitamin E 1000 UNIT capsule Take 1,000 Units by mouth daily.    [provider]      Allergies    Amoxicillin    Review of Systems   Review of Systems  Constitutional:  Negative for chills and fever.  Gastrointestinal:  Positive for nausea. Negative for abdominal pain, diarrhea and vomiting.  Genitourinary:  Positive for dysuria.    Physical Exam Updated Vital Signs BP 134/74   Pulse 80   Temp 98.7 F (37.1 C) (Oral)   Resp 18   Ht '5\' 2"'$  (1.575 m)   Wt 76.7 kg   SpO2 96%   BMI 30.91 kg/m  Physical Exam Vitals and nursing note reviewed.  Constitutional:      General: She is not in acute distress.    Appearance: Normal appearance. She is not ill-appearing, toxic-appearing or diaphoretic.  HENT:     Head: Normocephalic and atraumatic.     Nose: Nose normal. No congestion.     Mouth/Throat:     Mouth: Mucous membranes are moist.     Pharynx: Oropharynx is clear.  Eyes:     Extraocular Movements: Extraocular movements intact.     Conjunctiva/sclera: Conjunctivae normal.     Pupils: Pupils are equal, round, and reactive to light.  Cardiovascular:     Rate and Rhythm: Normal rate and regular rhythm.  Pulmonary:     Effort: Pulmonary effort is normal.     Breath sounds: Normal breath sounds. No wheezing.  Abdominal:     General: Abdomen is flat. Bowel sounds are normal.     Palpations: Abdomen is soft.     Tenderness: There is no abdominal tenderness. There is right CVA tenderness.  Musculoskeletal:     Cervical back: Normal range of motion and neck supple. No tenderness.  Skin:    General: Skin is warm and dry.     Capillary Refill: Capillary refill takes less than 2 seconds.  Neurological:     Mental Status: She is alert  and oriented to person, place, and time.     ED Results / Procedures / Treatments   Labs (all labs ordered are listed, but only abnormal results are displayed) Labs Reviewed  URINALYSIS, ROUTINE W REFLEX MICROSCOPIC - Abnormal; Notable for the following components:      Result Value   Color, Urine ORANGE (*)    Glucose, UA   (*)    Value: TEST NOT REPORTED DUE TO COLOR INTERFERENCE OF URINE PIGMENT   Hgb urine dipstick   (*)    Value: TEST NOT REPORTED DUE TO COLOR INTERFERENCE OF URINE PIGMENT   Bilirubin Urine   (*)    Value: TEST NOT REPORTED DUE TO COLOR INTERFERENCE OF URINE PIGMENT   Ketones, ur   (*)    Value: TEST NOT REPORTED DUE TO COLOR INTERFERENCE OF URINE PIGMENT   Protein, ur   (*)    Value: TEST NOT REPORTED DUE TO COLOR INTERFERENCE OF URINE PIGMENT   Nitrite   (*)    Value: TEST NOT REPORTED DUE TO COLOR INTERFERENCE OF URINE PIGMENT   Leukocytes,Ua   (*)    Value: TEST NOT REPORTED DUE TO COLOR INTERFERENCE OF URINE PIGMENT   All other components within normal limits  BASIC METABOLIC PANEL - Abnormal; Notable for the following components:   Sodium 133 (*)    Glucose, Bld 178 (*)    BUN 6 (*)    All other components within normal limits  URINALYSIS, MICROSCOPIC (REFLEX) - Abnormal; Notable for the following components:   Bacteria, UA RARE (*)    All other components within normal limits  URINE CULTURE  CBC WITH DIFFERENTIAL/PLATELET    EKG None  Radiology CT Renal Stone Study  Result Date: 07/15/2022 CLINICAL DATA:  Flank pain. Currently being treated for UTI with Bactrim. Abdominal pain worse with urination. EXAM: CT ABDOMEN AND PELVIS WITHOUT CONTRAST TECHNIQUE: Multidetector CT imaging of the abdomen and pelvis was performed following the standard protocol without IV contrast. RADIATION DOSE REDUCTION: This exam was performed according to the departmental dose-optimization program which includes automated exposure control, adjustment of the mA and/or  kV according to patient size and/or use of iterative reconstruction technique. COMPARISON:  None Available. FINDINGS: Lower chest: No acute abnormality. Hepatobiliary: No focal liver abnormality is seen. No gallstones, gallbladder wall thickening, or biliary dilatation. Pancreas: Unremarkable. No pancreatic ductal dilatation or surrounding inflammatory changes. Spleen: Normal in size without focal abnormality. Adrenals/Urinary Tract: Adrenal glands are unremarkable. Kidneys are normal, without renal calculi, focal lesion, or hydronephrosis. Bladder is unremarkable. Stomach/Bowel: Large hiatal hernia. Stomach is within normal limits. Appendix appears normal. No evidence of bowel wall thickening, distention, or inflammatory changes. Sigmoid colonic diverticulosis without evidence of acute diverticulitis. Vascular/Lymphatic: Mild aortic atherosclerosis. No enlarged abdominal  or pelvic lymph nodes. Reproductive: Status post hysterectomy. No adnexal masses. There is a hypodense collection at the level of the anterior vaginal wall/urethra. This may represent a urethral diverticulum, a Skene gland cyst or a Gartner's cyst. Evaluation is limited on this noncontrast enhanced examination. Other: No abdominal wall hernia or abnormality. No abdominopelvic ascites. Musculoskeletal: Degenerative disease of the lumbar spine. No acute osseous abnormality. IMPRESSION: 1.  No evidence of nephrolithiasis or hydronephrosis. 2. Sigmoid colonic diverticulosis without evidence of acute diverticulitis. Normal appendix. 3. No CT evidence of pyelonephritis, evaluation is however limited on nonenhanced examination. 4. Small hypodense collection at the level of the anterior vaginal wall/urethra, which may be a urethral diverticulum, a Skene gland cyst or a Gartner's cyst. It may be a risk factor for recurrent urinary tract infection. Further evaluation with MR examination would be helpful if clinically warranted. Electronically Signed   By:  Keane Police D.O.   On: 07/15/2022 17:53    Procedures Procedures   Medications Ordered in ED Medications  sodium chloride 0.9 % bolus 1,000 mL (0 mLs Intravenous Stopped 07/15/22 1817)  ketorolac (TORADOL) 15 MG/ML injection 15 mg (15 mg Intravenous Given 07/15/22 1702)  ondansetron (ZOFRAN) injection 4 mg (4 mg Intravenous Given 07/15/22 1702)  fentaNYL (SUBLIMAZE) injection 50 mcg (50 mcg Intravenous Given 07/15/22 1757)    ED Course/ Medical Decision Making/ A&P                           Medical Decision Making Amount and/or Complexity of Data Reviewed Labs: ordered. Radiology: ordered.  Risk Prescription drug management.   66 year old female presents to the ED for evaluation.  Please see HPI for further details.  On examination, patient is afebrile and nontachycardic.  Patient lung sounds clear bilaterally, not hypoxic.  Patient abdomen soft and compressible in all 4 quadrants.  Patient has right-sided CVA tenderness.  Patient nontoxic in appearance.  Patient worked up utilizing the following labs and imaging studies interpreted by me personally: - BMP with decreased sodium to 133 treated with 1 L normal saline, elevated glucose to 178 - CBC unremarkable, no leukocytosis - Urinalysis positive for orange color of urine.  The rest of the results of this testing were not reported due to the color of the patient's urine. - CT renal stone study pending  Patient treated with liter normal saline, 50 mg Toradol, 4 mg Zofran, 50 mcg of fentanyl  At the end my shift, the patient CT renal stone study had not yet resulted.  The patient was signed out to my attending Dr. Pearline Cables for further management.   Final Clinical Impression(s) / ED Diagnoses Final diagnoses:  Dysuria    Rx / DC Orders ED Discharge Orders     None         Azucena Cecil, PA-C 70/01/74 [id number]    Lianne Cure, DO 67/59/16 [id number]

## 2022-07-15 NOTE — ED Notes (Signed)
Dc instructions and scripts reviewed with pt no questions or concerns at this time. Will follow up. Wheeled to vehicle and transport home by spouse

## 2022-07-15 NOTE — ED Provider Notes (Signed)
6:00 PM Patient's care assumed by me from [facility].    Pt is a 66 yo female with dysuria, dribbling, and increased frequency. [facility] and started bactrim x 3 days. Completed course with minimal improvement and new right sided flank pain. [facility] culture was negative.   Cr stable.  UA unable to read due to color from Azo use. CT renal stone demonstrates: 1.  No evidence of nephrolithiasis or hydronephrosis. 2. Sigmoid colonic diverticulosis without evidence of acute diverticulitis. Normal appendix. 3. No CT evidence of pyelonephritis, evaluation is however limited on nonenhanced examination. 4. Small hypodense collection at the level of the anterior vaginal wall/urethra, which may be a urethral diverticulum, a Skene gland cyst or a Gartner's cyst. It may be a risk factor for recurrent urinary tract infection. Further evaluation with MR examination would be helpful if clinically warranted.  Pending re-eval after pain meds, order MR outpatient, f/u with Gyn and DC home.    Physical Exam  BP 113/74   Pulse 63   Temp 98.7 F (37.1 C) (Oral)   Resp 17   Ht '5\' 2"'$  (1.575 m)   Wt 76.7 kg   SpO2 95%   BMI 30.91 kg/m     Procedures  Procedures  ED Course / MDM    Medical Decision Making Amount and/or Complexity of Data Reviewed Labs: ordered. Radiology: ordered.  Risk Prescription drug management.   We will determine urinary tract infection based off of UA.  Culture sent.  Will treat patient for symptoms.  Keflex given in ED and sent to pharmacy.  Patient also has diverticula found on ultrasound with recommended follow-up with urologist versus OB/GYN.  Patient in no distress and overall condition improved here in the ED. Detailed discussions were had with the patient regarding current findings, and need for close f/u with PCP or on call doctor. The patient has been instructed to return immediately if the symptoms worsen in any way for  re-evaluation. Patient verbalized understanding and is in agreement with current care plan. All questions answered prior to discharge.        Lianne Cure, DO 02/02/14 [id number]

## 2022-07-16 ENCOUNTER — Other Ambulatory Visit (HOSPITAL_BASED_OUTPATIENT_CLINIC_OR_DEPARTMENT_OTHER): Payer: Self-pay

## 2022-07-16 LAB — URINE CULTURE: Culture: NO GROWTH

## 2022-08-21 ENCOUNTER — Other Ambulatory Visit: Payer: Self-pay | Admitting: Family

## 2022-10-25 ENCOUNTER — Other Ambulatory Visit: Payer: Self-pay

## 2022-10-25 MED ORDER — PANTOPRAZOLE SODIUM 40 MG PO TBEC: 40.0000 mg | DELAYED_RELEASE_TABLET | Freq: Every day | ORAL | 0 refills | Status: DC

## 2022-12-13 ENCOUNTER — Ambulatory Visit (INDEPENDENT_AMBULATORY_CARE_PROVIDER_SITE_OTHER): Payer: Medicare HMO | Admitting: Family

## 2022-12-13 VITALS — BP 104/80 | HR 75 | Resp 18 | Ht 62.0 in | Wt 171.0 lb

## 2022-12-13 DIAGNOSIS — R739 Hyperglycemia, unspecified: Secondary | ICD-10-CM

## 2022-12-13 DIAGNOSIS — Z0000 Encounter for general adult medical examination without abnormal findings: Secondary | ICD-10-CM

## 2022-12-13 DIAGNOSIS — N361 Urethral diverticulum: Secondary | ICD-10-CM

## 2022-12-13 DIAGNOSIS — E785 Hyperlipidemia, unspecified: Secondary | ICD-10-CM

## 2022-12-13 DIAGNOSIS — Z1231 Encounter for screening mammogram for malignant neoplasm of breast: Secondary | ICD-10-CM

## 2022-12-13 DIAGNOSIS — Z780 Asymptomatic menopausal state: Secondary | ICD-10-CM

## 2022-12-13 DIAGNOSIS — E0590 Thyrotoxicosis, unspecified without thyrotoxic crisis or storm: Secondary | ICD-10-CM | POA: Diagnosis not present

## 2022-12-13 LAB — COMPREHENSIVE METABOLIC PANEL
ALT: 14 U/L (ref 0–35)
AST: 22 U/L (ref 0–37)
Albumin: 4.4 g/dL (ref 3.5–5.2)
Alkaline Phosphatase: 65 U/L (ref 39–117)
BUN: 11 mg/dL (ref 6–23)
CO2: 29 mEq/L (ref 19–32)
Calcium: 9.5 mg/dL (ref 8.4–10.5)
Chloride: 102 mEq/L (ref 96–112)
Creatinine, Ser: 0.85 mg/dL (ref 0.40–1.20)
GFR: 72.04 mL/min (ref >60.00)
Glucose, Bld: 91 mg/dL (ref 70–99)
Potassium: 3.8 mEq/L (ref 3.5–5.1)
Sodium: 139 mEq/L (ref 135–145)
Total Bilirubin: 0.8 mg/dL (ref 0.2–1.2)
Total Protein: 6.9 g/dL (ref 6.0–8.3)

## 2022-12-13 LAB — HEMOGLOBIN A1C: Hgb A1c MFr Bld: 5.6 % (ref 4.6–6.5)

## 2022-12-13 LAB — LIPID PANEL
Cholesterol: 192 mg/dL (ref 0–200)
HDL: 71.1 mg/dL (ref >39.00)
LDL Cholesterol: 103 mg/dL — ABNORMAL HIGH (ref 0–99)
NonHDL: 120.86
Total CHOL/HDL Ratio: 3
Triglycerides: 89 mg/dL (ref 0.0–149.0)
VLDL: 17.8 mg/dL (ref 0.0–40.0)

## 2022-12-13 MED ORDER — ATORVASTATIN CALCIUM 20 MG PO TABS: 20.0000 mg | ORAL_TABLET | Freq: Every day | ORAL | 1 refills | Status: DC

## 2022-12-13 MED ORDER — PANTOPRAZOLE SODIUM 40 MG PO TBEC: 40.0000 mg | DELAYED_RELEASE_TABLET | Freq: Every day | ORAL | 1 refills | Status: DC

## 2022-12-13 NOTE — Progress Notes (Signed)
Subjective:   By signing my name below, I, Carylon Perches, attest that this documentation has been prepared under the direction and in the presence of [place], NP 12/13/2022    Patient ID: Dawn Huber, female    DOB: 02-22-1956, 65 y.o.   MRN: [id number]  Chief Complaint  Patient presents with   Annual Exam    HPI Patient is in today for a comprehensive physical exam.  Refill: She is requesting a refill of 40 mg of Protonix and 20 mg of Atorvastatin.   Urinary problem (urethral Diverticulum): Patient reports that she was admitted to the ED on 07/15/2022 for dysuria. She states that at the time, she was in pain and could hardly pee. She was given a CT scan and was reported to have diverticulum on her urethra. She was also prescribed 500 mg of Cephalexin. She was able to pass a blood clot the next day and reports of resolved symptoms. She notes at the time that her sugars were high which she contributes to consuming cranberry juice. Her sodium and BUM levels were also low. She was seen by her urologist, Dr.Woodburn on 09/19/2022.    Cholesterol:  She states that she has not been able to consistently take her 20 mg of Atorvastatin because her pharmacy informed her that it had to be approved by her PCP. Lab Results  Component Value Date   CHOL 160 12/11/2021   HDL 60.70 12/11/2021   LDLCALC 78 12/11/2021   LDLDIRECT 172.0 07/10/2015   TRIG 105.0 12/11/2021   CHOLHDL 3 12/11/2021   Reflux/GERD: She reports symptoms are stable. She is currently taking 40 mg of Protonix.   She denies having any fever, hearing or vision symptoms, new muscle pain, joint pain , new moles, rashes, congestion, sinus pain, sore throat, palpations, cough, SOB ,wheezing,n/v/d constipation, blood in stool, dysuria, frequency, hematuria, depression, anxiety, headaches at this time  Social history: She is currently vaping.  Colonoscopy: Last completed on 0209/2022 Dexa: Last completed on  02/10/2012 Mammogram: Last completed on 11/10/2017 Immunizations: She states that she is not interested in reciving updated immunizations.  Diet: She is no longer dieting.  Exercise: She states that around the spring, she was able to walk up to 4 miles a day, 5 days a week. However, her foot begun to hurt her and is therefore planning to return to regularly exercising in the near future.  Wt Readings from Last 3 Encounters:  12/13/22 171 lb (77.6 kg)  07/15/22 169 lb (76.7 kg)  12/11/21 178 lb 12.8 oz (81.1 kg)   Dental: She is not UTD on dental exam. However, she is scheduled to have an appointment.  Vision: She is not UTD on vision exams.   Health Maintenance Due  Topic Date Due   Medicare Annual Wellness (AWV)  Never done   COVID-19 Vaccine (1) Never done   Pneumonia Vaccine 55+ Years old (1 - PCV) Never done   Zoster Vaccines- Shingrix (1 of 2) Never done   MAMMOGRAM  11/10/2018   INFLUENZA VACCINE  07/23/2022   DEXA SCAN  10/28/2022    Past Medical History:  Diagnosis Date   Arthritis    Blood transfusion without reported diagnosis 2018   Diverticulosis    Genital warts    GERD (gastroesophageal reflux disease)    GI bleed due to NSAIDs 03/2017   hospitalized at [facility]   History of chicken pox    Hyperlipidemia    Osteopenia 02/18/2012  Personal history of colonic polyps     Past Surgical History:  Procedure Laterality Date   ABDOMINAL HYSTERECTOMY  1999   partial   cataract surgery Bilateral 2022   september and october   COLONOSCOPY  2016   TUBAL LIGATION      Family History  Problem Relation Age of Onset   Lung cancer Mother    Emphysema Father    Stroke Father    Seizures Father    Heart disease Father    Hypertension Father    Colon cancer Maternal Aunt 11   Hypertension Brother    Gout Son    Osteoarthritis Daughter    Varicose Veins Daughter    Breast cancer Neg Hx    Colon polyps Neg Hx    Esophageal cancer Neg Hx    Rectal cancer Neg  Hx    Stomach cancer Neg Hx     Social History   Socioeconomic History   Marital status: Married    Spouse name: Not on file   Number of children: 2   Years of education: Not on file   Highest education level: Not on file  Occupational History   Not on file  Tobacco Use   Smoking status: Former    Types: Cigarettes    Quit date: 08/16/2013    Years since quitting: 9.3   Smokeless tobacco: Never   Tobacco comments:    Pt states she "vapes".  Vaping Use   Vaping Use: Some days   Devices: 28m nicotine  Substance and Sexual Activity   Alcohol use: No    Alcohol/week: 0.0 standard drinks of alcohol   Drug use: No   Sexual activity: Not Currently  Other Topics Concern   Not on file  Social History Narrative   Regular exercise:  No, walks dog daily   Caffeine Use:  12oz pepsi daily   Works as a hHaematologist   She has 2 grown children and 4 grandchildren- family lives locally.   Married          Social Determinants of HRadio broadcast assistantStrain: Not on file  Food Insecurity: Not on file  Transportation Needs: Not on file  Physical Activity: Not on file  Stress: Not on file  Social Connections: Not on file  Intimate Partner Violence: Not on file    Outpatient Medications Prior to Visit  Medication Sig Dispense Refill   Ascorbic Acid (VITAMIN C PO) Take by mouth.     calcium carbonate (OS-CAL) 600 MG TABS tablet Take 600 mg by mouth 2 (two) times daily with a meal. Pt taking one tablet a day     calcium-vitamin D (OSCAL WITH D) 500-200 MG-UNIT tablet Take 1 tablet by mouth.     diphenhydramine-acetaminophen (TYLENOL PM) 25-500 MG TABS tablet Take 2 tablets by mouth at bedtime.     estradiol (ESTRACE) 0.1 MG/GM vaginal cream Place vaginally.     vitamin E 1000 UNIT capsule Take 1,000 Units by mouth daily.     atorvastatin (LIPITOR) 20 MG tablet Take 1 tablet by mouth once daily 90 tablet 0   pantoprazole (PROTONIX) 40 MG tablet Take 1 tablet (40 mg total) by  mouth daily. 90 tablet 0   No facility-administered medications prior to visit.    Allergies  Allergen Reactions   Amoxicillin Other (See Comments)    Itching of hands and feet    Review of Systems  Musculoskeletal:  Positive for joint pain (Heel Pain).  Objective:    Physical Exam Constitutional:      General: She is not in acute distress.    Appearance: Normal appearance. She is not ill-appearing.  HENT:     Head: Normocephalic and atraumatic.     Right Ear: Tympanic membrane, ear canal and external ear normal.     Left Ear: Tympanic membrane, ear canal and external ear normal.  Eyes:     Extraocular Movements: Extraocular movements intact.     Pupils: Pupils are equal, round, and reactive to light.  Cardiovascular:     Rate and Rhythm: Normal rate and regular rhythm.     Heart sounds: Normal heart sounds. No murmur heard.    No gallop.  Pulmonary:     Effort: Pulmonary effort is normal. No respiratory distress.     Breath sounds: Normal breath sounds. No wheezing or rales.  Abdominal:     General: Bowel sounds are normal. There is no distension.     Palpations: Abdomen is soft.     Tenderness: There is no abdominal tenderness. There is no guarding.  Musculoskeletal:     Comments: 5/5 strength in both upper and lower extremities    Skin:    General: Skin is warm and dry.  Neurological:     Mental Status: She is alert and oriented to person, place, and time.     Deep Tendon Reflexes:     Reflex Scores:      Patellar reflexes are 3+ on the right side and 3+ on the left side. Psychiatric:        Mood and Affect: Mood normal.        Behavior: Behavior normal.        Judgment: Judgment normal.     BP 104/80   Pulse 75   Resp 18   Ht _0  (1.575 m)   Wt 171 lb (77.6 kg)   SpO2 98%   BMI 31.28 kg/m  Wt Readings from Last 3 Encounters:  12/13/22 171 lb (77.6 kg)  07/15/22 169 lb (76.7 kg)  12/11/21 178 lb 12.8 oz (81.1 kg)       Assessment &  Plan:   Problem List Items Addressed This Visit       Unprioritized   Urethral diverticulum - Primary    New. Plan is for surgical repair with Urogynecology.       Subclinical hyperthyroidism    Clinically stable. Check TSH.       Relevant Orders   TSH   Hyperlipidemia    Not taking statin- did not have refill.  Restart.       Relevant Medications   atorvastatin (LIPITOR) 20 MG tablet   Other Relevant Orders   Lipid panel   Hyperglycemia   Relevant Orders   HgB A1c   Comp Met (CMET)   General medical examination    Wt Readings from Last 3 Encounters:  12/13/22 171 lb (77.6 kg)  07/15/22 169 lb (76.7 kg)  12/11/21 178 lb 12.8 oz (81.1 kg)  Continue work on [employer], exercise, weight loss.  Continues to vape- encouraged cessation. Mammo and colo due. Declines immunizations.        Other Visit Diagnoses     Postmenopausal estrogen deficiency       Relevant Orders   DG Bone Density   Breast cancer screening by mammogram       Relevant Orders   MM 3D SCREEN BREAST BILATERAL      Meds ordered this encounter  Medications   atorvastatin (LIPITOR) 20 MG tablet    Sig: Take 1 tablet (20 mg total) by mouth daily.    Dispense:  90 tablet    Refill:  1    Order Specific Question:   Supervising Provider    Answer:   Penni Homans A [[id number]]   pantoprazole (PROTONIX) 40 MG tablet    Sig: Take 1 tablet (40 mg total) by mouth daily.    Dispense:  90 tablet    Refill:  1    Order Specific Question:   Supervising Provider    Answer:   Penni Homans A [[id number]]    I, Nance Pear, NP, personally preformed the services described in this documentation.  All medical record entries made by the scribe were at my direction and in my presence.  I have reviewed the chart and discharge instructions (if applicable) and agree that the record reflects my personal performance and is accurate and complete. 12/13/2022   I,Amber Collins,acting as a scribe for Nance Pear, NP.,have documented all relevant documentation on the behalf of Nance Pear, NP,as directed by  Nance Pear, NP while in the presence of Nance Pear, NP.    Nance Pear, NP

## 2022-12-13 NOTE — Assessment & Plan Note (Signed)
Clinically stable. Check TSH.

## 2022-12-13 NOTE — Assessment & Plan Note (Signed)
Not taking statin- did not have refill.  Restart.

## 2022-12-13 NOTE — Assessment & Plan Note (Addendum)
Wt Readings from Last 3 Encounters:  12/13/22 171 lb (77.6 kg)  07/15/22 169 lb (76.7 kg)  12/11/21 178 lb 12.8 oz (81.1 kg)   Continue work on [employer], exercise, weight loss.  Continues to vape- encouraged cessation. Mammo and colo due. Declines immunizations.

## 2022-12-13 NOTE — Progress Notes (Deleted)
Subjective:     Patient ID: Dawn Huber, female    DOB: 24-Aug-1956, 66 y.o.   MRN: [id number]  Chief Complaint  Patient presents with   Annual Exam    HPI Patient is in today for ***  Health Maintenance Due  Topic Date Due   Medicare Annual Wellness (AWV)  Never done   COVID-19 Vaccine (1) Never done   Pneumonia Vaccine 51+ Years old (1 - PCV) Never done   Zoster Vaccines- Shingrix (1 of 2) Never done   MAMMOGRAM  11/10/2018   INFLUENZA VACCINE  07/23/2022   DEXA SCAN  10/28/2022    Past Medical History:  Diagnosis Date   Arthritis    Blood transfusion without reported diagnosis 2018   Diverticulosis    Genital warts    GERD (gastroesophageal reflux disease)    GI bleed due to NSAIDs 03/2017   hospitalized at [facility]   History of chicken pox    Hyperlipidemia    Osteopenia 02/18/2012   Personal history of colonic polyps     Past Surgical History:  Procedure Laterality Date   ABDOMINAL HYSTERECTOMY  1999   partial   cataract surgery Bilateral 2022   september and october   COLONOSCOPY  2016   TUBAL LIGATION      Family History  Problem Relation Age of Onset   Lung cancer Mother    Emphysema Father    Stroke Father    Seizures Father    Heart disease Father    Hypertension Father    Colon cancer Maternal Aunt 47   Hypertension Brother    Gout Son    Osteoarthritis Daughter    Varicose Veins Daughter    Breast cancer Neg Hx    Colon polyps Neg Hx    Esophageal cancer Neg Hx    Rectal cancer Neg Hx    Stomach cancer Neg Hx     Social History   Socioeconomic History   Marital status: Married    Spouse name: Not on file   Number of children: 2   Years of education: Not on file   Highest education level: Not on file  Occupational History   Not on file  Tobacco Use   Smoking status: Former    Types: Cigarettes    Quit date: 08/16/2013    Years since quitting: 9.3   Smokeless tobacco: Never   Tobacco comments:    Pt states she  "vapes".  Vaping Use   Vaping Use: Some days   Devices: '3mg'$  nicotine  Substance and Sexual Activity   Alcohol use: No    Alcohol/week: 0.0 standard drinks of alcohol   Drug use: No   Sexual activity: Not Currently  Other Topics Concern   Not on file  Social History Narrative   Regular exercise:  No, walks dog daily   Caffeine Use:  12oz pepsi daily   Works as a Haematologist.   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   Married          Social Determinants of Radio broadcast assistant Strain: Not on file  Food Insecurity: Not on file  Transportation Needs: Not on file  Physical Activity: Not on file  Stress: Not on file  Social Connections: Not on file  Intimate Partner Violence: Not on file    Outpatient Medications Prior to Visit  Medication Sig Dispense Refill   Ascorbic Acid (VITAMIN C PO) Take by mouth.  atorvastatin (LIPITOR) 20 MG tablet Take 1 tablet by mouth once daily 90 tablet 0   calcium carbonate (OS-CAL) 600 MG TABS tablet Take 600 mg by mouth 2 (two) times daily with a meal. Pt taking one tablet a day     calcium-vitamin D (OSCAL WITH D) 500-200 MG-UNIT tablet Take 1 tablet by mouth.     diphenhydramine-acetaminophen (TYLENOL PM) 25-500 MG TABS tablet Take 2 tablets by mouth at bedtime.     estradiol (ESTRACE) 0.1 MG/GM vaginal cream Place vaginally.     pantoprazole (PROTONIX) 40 MG tablet Take 1 tablet (40 mg total) by mouth daily. 90 tablet 0   vitamin E 1000 UNIT capsule Take 1,000 Units by mouth daily.     No facility-administered medications prior to visit.    Allergies  Allergen Reactions   Amoxicillin Other (See Comments)    Itching of hands and feet    ROS     Objective:    Physical Exam  BP 104/80   Pulse 75   Resp 18   Ht '5\' 2"'$  (1.575 m)   Wt 171 lb (77.6 kg)   SpO2 98%   BMI 31.28 kg/m  Wt Readings from Last 3 Encounters:  12/13/22 171 lb (77.6 kg)  07/15/22 169 lb (76.7 kg)  12/11/21 178 lb  12.8 oz (81.1 kg)       Assessment & Plan:   Problem List Items Addressed This Visit   None   I am having Dawn Huber maintain her calcium carbonate, diphenhydramine-acetaminophen, Ascorbic Acid (VITAMIN C PO), vitamin E, calcium-vitamin D, atorvastatin, pantoprazole, and estradiol.  No orders of the defined types were placed in this encounter.

## 2022-12-13 NOTE — Assessment & Plan Note (Signed)
New. Plan is for surgical repair with Urogynecology.

## 2022-12-14 LAB — TSH: TSH: 1.44 u[IU]/mL (ref 0.35–5.50)

## 2023-01-21 ENCOUNTER — Inpatient Hospital Stay (HOSPITAL_BASED_OUTPATIENT_CLINIC_OR_DEPARTMENT_OTHER): Admission: RE | Admit: 2023-01-21 | Payer: Medicare HMO | Source: Ambulatory Visit

## 2023-01-21 ENCOUNTER — Other Ambulatory Visit (HOSPITAL_BASED_OUTPATIENT_CLINIC_OR_DEPARTMENT_OTHER): Payer: Medicare HMO

## 2023-01-23 DIAGNOSIS — K5730 Diverticulosis of large intestine without perforation or abscess without bleeding: Secondary | ICD-10-CM | POA: Diagnosis not present

## 2023-01-23 DIAGNOSIS — N958 Other specified menopausal and perimenopausal disorders: Secondary | ICD-10-CM | POA: Diagnosis not present

## 2023-01-23 DIAGNOSIS — N83312 Acquired atrophy of left ovary: Secondary | ICD-10-CM | POA: Diagnosis not present

## 2023-01-23 DIAGNOSIS — Z90710 Acquired absence of both cervix and uterus: Secondary | ICD-10-CM | POA: Diagnosis not present

## 2023-01-23 DIAGNOSIS — N361 Urethral diverticulum: Secondary | ICD-10-CM | POA: Diagnosis not present

## 2023-01-23 HISTORY — PX: URETHRA SURGERY: SHX824

## 2023-01-29 ENCOUNTER — Encounter (HOSPITAL_BASED_OUTPATIENT_CLINIC_OR_DEPARTMENT_OTHER): Payer: Self-pay

## 2023-01-29 ENCOUNTER — Ambulatory Visit (HOSPITAL_BASED_OUTPATIENT_CLINIC_OR_DEPARTMENT_OTHER)
Admission: RE | Admit: 2023-01-29 | Discharge: 2023-01-29 | Disposition: A | Discharge status: Home / Self Care | Payer: Medicare HMO | Source: Ambulatory Visit | Attending: Family | Admitting: Family

## 2023-01-29 ENCOUNTER — Encounter: Payer: Self-pay | Admitting: Family

## 2023-01-29 ENCOUNTER — Other Ambulatory Visit: Payer: Self-pay | Admitting: Family

## 2023-01-29 DIAGNOSIS — M810 Age-related osteoporosis without current pathological fracture: Secondary | ICD-10-CM

## 2023-01-29 DIAGNOSIS — Z780 Asymptomatic menopausal state: Secondary | ICD-10-CM

## 2023-01-29 DIAGNOSIS — Z1231 Encounter for screening mammogram for malignant neoplasm of breast: Secondary | ICD-10-CM | POA: Diagnosis not present

## 2023-01-29 NOTE — Telephone Encounter (Signed)
Please advise pt that her bone density has worsened a lot since it was last checked in 2013.  Given these results I would recommend that she add fosamax '70mg'$  once weekly in the AM to help improve her bone health.  Sit upright for 90 minutes after taking. Repeat bone density 1-2 years. Continue caltrate '600mg'$  twice daily as well as making sure she gets regular weight bearing exercise such as walking. Rx pended.

## 2023-02-03 DIAGNOSIS — N361 Urethral diverticulum: Secondary | ICD-10-CM | POA: Diagnosis not present

## 2023-02-03 DIAGNOSIS — N958 Other specified menopausal and perimenopausal disorders: Secondary | ICD-10-CM | POA: Diagnosis not present

## 2023-02-03 DIAGNOSIS — Z87440 Personal history of urinary (tract) infections: Secondary | ICD-10-CM | POA: Diagnosis not present

## 2023-02-03 MED ORDER — ALENDRONATE SODIUM 70 MG PO TABS: 70.0000 mg | ORAL_TABLET | ORAL | 4 refills | Status: DC

## 2023-02-03 NOTE — Telephone Encounter (Signed)
Patient advised of results and recommendations from provider, she verbalized agreeing with plan of care.

## 2023-02-24 DIAGNOSIS — R8290 Unspecified abnormal findings in urine: Secondary | ICD-10-CM | POA: Diagnosis not present

## 2023-02-24 DIAGNOSIS — N2889 Other specified disorders of kidney and ureter: Secondary | ICD-10-CM | POA: Diagnosis not present

## 2023-02-24 DIAGNOSIS — Z01818 Encounter for other preprocedural examination: Secondary | ICD-10-CM | POA: Diagnosis not present

## 2023-03-11 DIAGNOSIS — E785 Hyperlipidemia, unspecified: Secondary | ICD-10-CM | POA: Diagnosis not present

## 2023-03-11 DIAGNOSIS — Z79899 Other long term (current) drug therapy: Secondary | ICD-10-CM | POA: Diagnosis not present

## 2023-03-11 DIAGNOSIS — K219 Gastro-esophageal reflux disease without esophagitis: Secondary | ICD-10-CM | POA: Diagnosis not present

## 2023-03-11 DIAGNOSIS — N361 Urethral diverticulum: Secondary | ICD-10-CM | POA: Diagnosis not present

## 2023-03-11 DIAGNOSIS — E0580 Other thyrotoxicosis without thyrotoxic crisis or storm: Secondary | ICD-10-CM | POA: Diagnosis not present

## 2023-03-11 DIAGNOSIS — N368 Other specified disorders of urethra: Secondary | ICD-10-CM | POA: Diagnosis not present

## 2023-03-24 DIAGNOSIS — N2889 Other specified disorders of kidney and ureter: Secondary | ICD-10-CM | POA: Diagnosis not present

## 2023-03-24 DIAGNOSIS — R8290 Unspecified abnormal findings in urine: Secondary | ICD-10-CM | POA: Diagnosis not present

## 2023-04-15 ENCOUNTER — Other Ambulatory Visit: Payer: Self-pay | Admitting: Family

## 2023-04-28 DIAGNOSIS — N952 Postmenopausal atrophic vaginitis: Secondary | ICD-10-CM | POA: Diagnosis not present

## 2023-04-28 DIAGNOSIS — Z9889 Other specified postprocedural states: Secondary | ICD-10-CM | POA: Diagnosis not present

## 2023-04-28 DIAGNOSIS — N2889 Other specified disorders of kidney and ureter: Secondary | ICD-10-CM | POA: Diagnosis not present

## 2023-04-28 DIAGNOSIS — N390 Urinary tract infection, site not specified: Secondary | ICD-10-CM | POA: Diagnosis not present

## 2023-06-03 DIAGNOSIS — L570 Actinic keratosis: Secondary | ICD-10-CM | POA: Diagnosis not present

## 2023-06-03 DIAGNOSIS — L2089 Other atopic dermatitis: Secondary | ICD-10-CM | POA: Diagnosis not present

## 2023-06-04 ENCOUNTER — Other Ambulatory Visit: Payer: Self-pay | Admitting: Family

## 2023-06-04 DIAGNOSIS — E785 Hyperlipidemia, unspecified: Secondary | ICD-10-CM

## 2023-07-13 ENCOUNTER — Other Ambulatory Visit: Payer: Self-pay | Admitting: Family

## 2023-08-27 ENCOUNTER — Other Ambulatory Visit: Payer: Self-pay | Admitting: Family

## 2023-08-27 DIAGNOSIS — E785 Hyperlipidemia, unspecified: Secondary | ICD-10-CM

## 2023-09-04 DIAGNOSIS — L821 Other seborrheic keratosis: Secondary | ICD-10-CM | POA: Diagnosis not present

## 2023-09-04 DIAGNOSIS — L570 Actinic keratosis: Secondary | ICD-10-CM | POA: Diagnosis not present

## 2023-09-04 DIAGNOSIS — L814 Other melanin hyperpigmentation: Secondary | ICD-10-CM | POA: Diagnosis not present

## 2023-10-04 ENCOUNTER — Other Ambulatory Visit: Payer: Self-pay | Admitting: Family

## 2023-10-04 DIAGNOSIS — E785 Hyperlipidemia, unspecified: Secondary | ICD-10-CM

## 2023-10-06 MED ORDER — ATORVASTATIN CALCIUM 20 MG PO TABS: 20.0000 mg | ORAL_TABLET | Freq: Every day | ORAL | 0 refills | Status: DC

## 2023-10-09 ENCOUNTER — Other Ambulatory Visit: Payer: Self-pay | Admitting: Family

## 2023-12-15 ENCOUNTER — Encounter: Payer: Medicare HMO | Admitting: Family

## 2023-12-29 ENCOUNTER — Other Ambulatory Visit: Payer: Self-pay | Admitting: Family

## 2023-12-29 DIAGNOSIS — E785 Hyperlipidemia, unspecified: Secondary | ICD-10-CM

## 2024-01-21 ENCOUNTER — Encounter: Payer: Self-pay | Admitting: Family

## 2024-01-21 ENCOUNTER — Ambulatory Visit: Payer: Medicare HMO | Admitting: Family

## 2024-01-21 VITALS — BP 116/69 | HR 53 | Temp 98.5°F | Resp 16 | Ht 62.0 in | Wt 162.0 lb

## 2024-01-21 DIAGNOSIS — Z1231 Encounter for screening mammogram for malignant neoplasm of breast: Secondary | ICD-10-CM | POA: Diagnosis not present

## 2024-01-21 DIAGNOSIS — Z0000 Encounter for general adult medical examination without abnormal findings: Secondary | ICD-10-CM

## 2024-01-21 DIAGNOSIS — E785 Hyperlipidemia, unspecified: Secondary | ICD-10-CM | POA: Diagnosis not present

## 2024-01-21 DIAGNOSIS — R634 Abnormal weight loss: Secondary | ICD-10-CM

## 2024-01-21 DIAGNOSIS — K219 Gastro-esophageal reflux disease without esophagitis: Secondary | ICD-10-CM

## 2024-01-21 DIAGNOSIS — M810 Age-related osteoporosis without current pathological fracture: Secondary | ICD-10-CM | POA: Diagnosis not present

## 2024-01-21 DIAGNOSIS — D649 Anemia, unspecified: Secondary | ICD-10-CM | POA: Diagnosis not present

## 2024-01-21 LAB — CBC WITH DIFFERENTIAL/PLATELET
Basophils Absolute: 0 10*3/uL (ref 0.0–0.1)
Basophils Relative: 0.7 % (ref 0.0–3.0)
Eosinophils Absolute: 0.1 10*3/uL (ref 0.0–0.7)
Eosinophils Relative: 1.7 % (ref 0.0–5.0)
HCT: 38.8 % (ref 36.0–46.0)
Hemoglobin: 12.6 g/dL (ref 12.0–15.0)
Lymphocytes Relative: 25.3 % (ref 12.0–46.0)
Lymphs Abs: 1.5 10*3/uL (ref 0.7–4.0)
MCHC: 32.5 g/dL (ref 30.0–36.0)
MCV: 92.8 fL (ref 78.0–100.0)
Monocytes Absolute: 0.5 10*3/uL (ref 0.1–1.0)
Monocytes Relative: 9.3 % (ref 3.0–12.0)
Neutro Abs: 3.6 10*3/uL (ref 1.4–7.7)
Neutrophils Relative %: 63 % (ref 43.0–77.0)
Platelets: 224 10*3/uL (ref 150.0–400.0)
RBC: 4.18 Mil/uL (ref 3.87–5.11)
RDW: 14.7 % (ref 11.5–15.5)
WBC: 5.8 10*3/uL (ref 4.0–10.5)

## 2024-01-21 LAB — IBC + FERRITIN
Ferritin: 6.2 ng/mL — ABNORMAL LOW (ref 10.0–291.0)
Iron: 76 ug/dL (ref 42–145)
Saturation Ratios: 17.9 % — ABNORMAL LOW (ref 20.0–50.0)
TIBC: 425.6 ug/dL (ref 250.0–450.0)
Transferrin: 304 mg/dL (ref 212.0–360.0)

## 2024-01-21 LAB — COMPREHENSIVE METABOLIC PANEL
ALT: 13 U/L (ref 0–35)
AST: 18 U/L (ref 0–37)
Albumin: 4.3 g/dL (ref 3.5–5.2)
Alkaline Phosphatase: 42 U/L (ref 39–117)
BUN: 7 mg/dL (ref 6–23)
CO2: 28 meq/L (ref 19–32)
Calcium: 9.2 mg/dL (ref 8.4–10.5)
Chloride: 104 meq/L (ref 96–112)
Creatinine, Ser: 0.81 mg/dL (ref 0.40–1.20)
GFR: 75.74 mL/min (ref >60.00)
Glucose, Bld: 88 mg/dL (ref 70–99)
Potassium: 4.1 meq/L (ref 3.5–5.1)
Sodium: 139 meq/L (ref 135–145)
Total Bilirubin: 0.7 mg/dL (ref 0.2–1.2)
Total Protein: 6.6 g/dL (ref 6.0–8.3)

## 2024-01-21 LAB — LIPID PANEL
Cholesterol: 156 mg/dL (ref 0–200)
HDL: 77.6 mg/dL (ref >39.00)
LDL Cholesterol: 63 mg/dL (ref 0–99)
NonHDL: 78.62
Total CHOL/HDL Ratio: 2
Triglycerides: 79 mg/dL (ref 0.0–149.0)
VLDL: 15.8 mg/dL (ref 0.0–40.0)

## 2024-01-21 LAB — TSH: TSH: 1.1 u[IU]/mL (ref 0.35–5.50)

## 2024-01-21 MED ORDER — PANTOPRAZOLE SODIUM 40 MG PO TBEC: 40.0000 mg | DELAYED_RELEASE_TABLET | Freq: Every day | ORAL | 1 refills | Status: DC

## 2024-01-21 MED ORDER — ATORVASTATIN CALCIUM 20 MG PO TABS: 20.0000 mg | ORAL_TABLET | Freq: Every day | ORAL | 0 refills | Status: DC

## 2024-01-21 MED ORDER — ALENDRONATE SODIUM 70 MG PO TABS: 70.0000 mg | ORAL_TABLET | ORAL | 4 refills | Status: DC

## 2024-01-21 NOTE — Progress Notes (Signed)
Subjective:     Patient ID: Dawn Huber, female    DOB: 03-Jun-1956, 68 y.o.   MRN: [id number]  Chief Complaint  Patient presents with   Annual Exam    HPI  Discussed the use of AI scribe software for clinical note transcription with the patient, who gave verbal consent to proceed.  History of Present Illness   The patient is a 68 year old female who presents for annual physical.   She denies current cough, cold symptoms, skin concerns, hearing or vision issues, leg swelling, digestive issues, muscle or joint pain, headaches, and concerns about depression or anxiety.  She has experienced weight loss since retiring and notes a change in her appetite, stating she eats only when hungry. She has developed a sweet tooth and is concerned about her triglycerides.  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 Since the surgery, she has not experienced any urinary tract infections or kidney issues, and her urine flow is described as 'perfect.'  She has a history of colon polyps, with precancerous polyps removed during previous colonoscopies. Her last colonoscopy was in 2022, and she plans to schedule another within the next year due to a family history of colon cancer.  She has been taking Fosamax for bone density and had a bone density test last year. She notes a difference in her bone structure as she has aged.  She quit smoking in 2014 after smoking since age 45, averaging a pack and a half per day. She now vapes occasionally. Since quitting smoking, she has not experienced bronchitis and feels her lungs are healthier.  She is retired and has not been exercising regularly since her urethral surgery. She is due for several vaccinations, including tetanus, pneumonia, and shingles, and is also due for a colonoscopy, mammogram, and lung cancer screening CT scan  based on her smoking history.     Wt Readings from Last 3 Encounters:  01/21/24 162 lb (73.5 kg)  12/13/22 171 lb (77.6 kg)  07/15/22 169 lb (76.7 kg)    Patient presents today for complete physical.  Immunizations: declines shingrix and pneumovax.  Would consider Td at pharmacy Diet: generally healthy Exercise: not exercises regularly Colonoscopy: she will schedule  Dexa: 2/27 osteoporosis, on fosamax Pap Smear: partial hysterectomy Mammogram: due in February   2014 quit, started age 60   Health Maintenance Due  Topic Date Due   Medicare Annual Wellness (AWV)  Never done   COVID-19 Vaccine (1) Never done   Zoster Vaccines- Shingrix (1 of 2) Never done   Pneumonia Vaccine 42+ Years old (1 of 1 - PCV) Never done   DTaP/Tdap/Td (3 - Td or Tdap) 02/08/2023   Colonoscopy  02/01/2024    Past Medical History:  Diagnosis Date   Arthritis    Blood transfusion without reported diagnosis 2018   Diverticulosis    Genital warts    GERD (gastroesophageal reflux disease)    GI bleed due to NSAIDs 03/2017   hospitalized at [facility]   History of chicken pox    Hyperlipidemia    Osteoporosis    Personal history of colonic polyps     Past Surgical History:  Procedure Laterality Date   ABDOMINAL HYSTERECTOMY  1999   partial   cataract surgery Bilateral 2022   september and october   COLONOSCOPY  2016   TUBAL LIGATION  URETHRA SURGERY  01/2023   urethral diverticulum surgery    Family History  Problem Relation Age of Onset   Lung cancer Mother    Emphysema Father    Stroke Father    Seizures Father    Heart disease Father    Hypertension Father    Colon cancer Maternal Aunt 65   Hypertension Brother    Gout Son    Osteoarthritis Daughter    Varicose Veins Daughter    Breast cancer Neg Hx    Colon polyps Neg Hx    Esophageal cancer Neg Hx    Rectal cancer Neg Hx    Stomach cancer Neg Hx     Social History   Socioeconomic History   Marital status:  Married    Spouse name: Not on file   Number of children: 2   Years of education: Not on file   Highest education level: Not on file  Occupational History   Not on file  Tobacco Use   Smoking status: Former    Current packs/day: 0.00    Types: Cigarettes    Quit date: 08/16/2013    Years since quitting: 10.4   Smokeless tobacco: Never   Tobacco comments:    Pt states she "vapes".  Vaping Use   Vaping status: Some Days   Devices: 3mg  nicotine  Substance and Sexual Activity   Alcohol use: No    Alcohol/week: 0.0 standard drinks of alcohol   Drug use: No   Sexual activity: Not Currently  Other Topics Concern   Not on file  Social History Narrative   Regular exercise:  No, walks dog daily   Caffeine Use:  12oz pepsi daily   Retired Social worker   She has 2 grown children and 4 grandchildren- family lives locally.   Married          Social Drivers of Corporate investment banker Strain: Not on file  Food Insecurity: Not on file  Transportation Needs: Not on file  Physical Activity: Not on file  Stress: Not on file  Social Connections: Not on file  Intimate Partner Violence: Not on file    Outpatient Medications Prior to Visit  Medication Sig Dispense Refill   Ascorbic Acid (VITAMIN C PO) Take by mouth.     calcium-vitamin D (OSCAL WITH D) 500-200 MG-UNIT tablet Take 1 tablet by mouth.     diphenhydramine-acetaminophen (TYLENOL PM) 25-500 MG TABS tablet Take 2 tablets by mouth at bedtime.     estradiol (ESTRACE) 0.1 MG/GM vaginal cream Place vaginally.     vitamin E 1000 UNIT capsule Take 1,000 Units by mouth daily.     alendronate (FOSAMAX) 70 MG tablet Take 1 tablet (70 mg total) by mouth every 7 (seven) days. Take with a full glass of water on an empty stomach. 12 tablet 4   atorvastatin (LIPITOR) 20 MG tablet TAKE 1 TABLET BY MOUTH ONCE DAILY . APPOINTMENT REQUIRED FOR FUTURE REFILLS 90 tablet 0   pantoprazole (PROTONIX) 40 MG tablet Take 1 tablet (40 mg total) by  mouth daily. 90 tablet 1   No facility-administered medications prior to visit.    Allergies  Allergen Reactions   Amoxicillin Other (See Comments)    Itching of hands and feet    Review of Systems  Constitutional:  Positive for weight loss.  HENT:  Negative for congestion and hearing loss.   Eyes:  Negative for blurred vision.  Cardiovascular:  Negative for leg swelling.  Gastrointestinal:  Negative for constipation and diarrhea.  Genitourinary:  Negative for dysuria and frequency.  Musculoskeletal:  Negative for joint pain and myalgias.  Skin:  Negative for rash.  Neurological:  Negative for headaches.  Psychiatric/Behavioral:         Denies depression/anxiety       Objective:    Physical Exam   BP 116/69 (BP Location: Right Arm, Patient Position: Sitting, Cuff Size: Small)   Pulse (!) 53   Temp 98.5 F (36.9 C) (Oral)   Resp 16   Ht 5\' 2"  (1.575 m)   Wt 162 lb (73.5 kg)   SpO2 99%   BMI 29.63 kg/m  Wt Readings from Last 3 Encounters:  01/21/24 162 lb (73.5 kg)  12/13/22 171 lb (77.6 kg)  07/15/22 169 lb (76.7 kg)   Physical Exam  Constitutional: She is oriented to person, place, and time. She appears well-developed and well-nourished. No distress.  HENT:  Head: Normocephalic and atraumatic.  Right Ear: Tympanic membrane and ear canal normal.  Left Ear: Tympanic membrane and ear canal normal.  Mouth/Throat: Oropharynx is clear and moist.  Eyes: Pupils are equal, round, and reactive to light. No scleral icterus.  Neck: Normal range of motion. No thyromegaly present.  Cardiovascular: Normal rate and regular rhythm.   No murmur heard. Pulmonary/Chest: Effort normal and breath sounds normal. No respiratory distress. He has no wheezes. She has no rales. She exhibits no tenderness.  Abdominal: Soft. Bowel sounds are normal. She exhibits no distension and no mass. There is no tenderness. There is no rebound and no guarding.  Musculoskeletal: She exhibits no  edema.  Lymphadenopathy:    She has no cervical adenopathy.  Neurological: She is alert and oriented to person, place, and time. She has normal patellar reflexes. She exhibits normal muscle tone. Coordination normal.  Skin: Skin is warm and dry.  Psychiatric: She has a normal mood and affect. Her behavior is normal. Judgment and thought content normal.  Breast/pelvic: deferred           Assessment & Plan:       Assessment & Plan:   Problem List Items Addressed This Visit       Unprioritized   Preventative health care    Colon Cancer Screening Last colonoscopy in 2022 with precancerous polyps removed. Family history of colon cancer. -Recommend scheduling colonoscopy within the next year due to history of precancerous polyps and family history of colon cancer.  Breast Cancer Screening Last mammogram in February 2024. -Recommend scheduling mammogram in February 2025.  Lung Cancer Screening History of heavy smoking, quit in 2014. No current respiratory symptoms. -Order low-dose CT scan for lung cancer screening.  Physical Inactivity Not currently exercising regularly. -Encourage resumption of walking and utilization of Silver Sneakers program.      Osteoporosis without current pathological fracture    On Fosamax. -Continue Fosamax. -Plan for bone density scan in 1-2 years.      Relevant Medications   alendronate (FOSAMAX) 70 MG tablet   Hyperlipidemia   Maintained on lipitor.  Update lipid panel.       Relevant Medications   atorvastatin (LIPITOR) 20 MG tablet   Other Relevant Orders   Lipid panel   Comp Met (CMET)   GERD (gastroesophageal reflux disease)   Relevant Medications   pantoprazole (PROTONIX) 40 MG tablet   Anemia   Relevant Orders   CBC w/Diff   IBC + Ferritin   Other Visit Diagnoses       Breast  cancer screening by mammogram    -  Primary   Relevant Orders   MM 3D SCREENING MAMMOGRAM BILATERAL BREAST   CT CHEST LUNG CA SCREEN LOW DOSE  W/O CM     Weight loss       Relevant Orders   TSH       I have changed Dawn Sciara. Huber's atorvastatin. I am also having her maintain her diphenhydramine-acetaminophen, Ascorbic Acid (VITAMIN C PO), vitamin E, calcium-vitamin D, estradiol, pantoprazole, and alendronate.  Meds ordered this encounter  Medications   atorvastatin (LIPITOR) 20 MG tablet    Sig: Take 1 tablet (20 mg total) by mouth daily.    Dispense:  90 tablet    Refill:  0    Supervising Provider:   Danise Edge A [[id number]]   pantoprazole (PROTONIX) 40 MG tablet    Sig: Take 1 tablet (40 mg total) by mouth daily.    Dispense:  90 tablet    Refill:  1    Supervising Provider:   Danise Edge A [[id number]]   alendronate (FOSAMAX) 70 MG tablet    Sig: Take 1 tablet (70 mg total) by mouth every 7 (seven) days. Take with a full glass of water on an empty stomach.    Dispense:  12 tablet    Refill:  4    Supervising Provider:   Danise Edge A [[id number]]

## 2024-01-21 NOTE — Assessment & Plan Note (Signed)
  On Fosamax. -Continue Fosamax. -Plan for bone density scan in 1-2 years.

## 2024-01-21 NOTE — Assessment & Plan Note (Signed)
Maintained on lipitor. Update lipid panel.

## 2024-01-21 NOTE — Assessment & Plan Note (Signed)
Stable on omeprazole, continue same.

## 2024-01-21 NOTE — Assessment & Plan Note (Addendum)
  Colon Cancer Screening Last colonoscopy in 2022 with precancerous polyps removed. Family history of colon cancer. -Recommend scheduling colonoscopy within the next year due to history of precancerous polyps and family history of colon cancer.  Breast Cancer Screening Last mammogram in February 2024. -Recommend scheduling mammogram in February 2025.  Lung Cancer Screening History of heavy smoking, quit in 2014. No current respiratory symptoms. -Order low-dose CT scan for lung cancer screening.  Physical Inactivity Not currently exercising regularly. -Encourage resumption of walking and utilization of Silver Sneakers program.  Advised pt to try to stop vaping. She declines vaccinations

## 2024-01-21 NOTE — Patient Instructions (Signed)
VISIT SUMMARY:  Today, we reviewed your overall health and addressed several specific concerns. You reported unintentional weight loss and a change in appetite, and we discussed your history of hyperlipidemia, osteoporosis, and previous urethral surgery. We also reviewed your need for various screenings and vaccinations.  YOUR PLAN:  -UNINTENTIONAL WEIGHT LOSS: Unintentional weight loss means losing weight without trying. We will check your blood with a comprehensive metabolic panel, thyroid function tests, and a complete blood count to find out why this is happening.  -HYPERLIPIDEMIA: Hyperlipidemia means having high levels of fats in your blood, which can increase the risk of heart disease. We will check your lipid levels today to see how well your current treatment is working.  -IMMUNIZATIONS: You are due for several vaccinations. You declined most but are open to getting a tetanus shot. You can get the tetanus vaccine at the pharmacy.  -OSTEOPOROSIS: Osteoporosis is a condition where bones become weak and brittle. You are currently taking Fosamax, and we will plan for a bone density scan in 1-2 years to monitor your bone health.  -URETHRAL DIVERTICULUM: A urethral diverticulum is a pocket or pouch that forms along the urethra. You had surgery last year, and since then, you have had no further issues. No further action is needed at this time.  -COLON CANCER SCREENING: Due to your history of precancerous polyps and family history of colon cancer, it is important to have regular screenings. Please schedule a colonoscopy within the next year.  -BREAST CANCER SCREENING: Regular mammograms are important for early detection of breast cancer. Please schedule your next mammogram in February 2025.  -LUNG CANCER SCREENING: Given your history of heavy smoking, it is important to screen for lung cancer. We will order a low-dose CT scan to check your lungs.  -PHYSICAL INACTIVITY: Regular exercise is  important for overall health. Please try to resume walking and consider joining the [employer] program.  -IRON STATUS: We need to check your iron levels to ensure they are within a healthy range. We will do iron studies today.  -MEDICATION REFILLS: We have sent refills for your Lipitor, pantoprazole, estrogen cream, and Fosamax to the pharmacy.  INSTRUCTIONS:  Please follow up with the recommended blood tests and schedule your colonoscopy, mammogram, and lung cancer screening as discussed. Additionally, consider getting the tetanus vaccine at your pharmacy.

## 2024-02-05 ENCOUNTER — Telehealth (HOSPITAL_BASED_OUTPATIENT_CLINIC_OR_DEPARTMENT_OTHER): Payer: Self-pay

## 2024-05-18 ENCOUNTER — Telehealth: Payer: Self-pay

## 2024-05-18 NOTE — Telephone Encounter (Signed)
 Pharmacy updated.

## 2024-05-18 NOTE — Telephone Encounter (Signed)
 Copied from CRM [id number]. Topic: Clinical - Prescription Issue >> May 18, 2024  9:20 AM Crispin Dolphin wrote: Reason for CRM: Patient called wanted added new pharmacy on mychart but old one is still showing for medication. Did not want to delete list of medications. Would like any new Rx or refills to be sent to [id number] - [place], [place] - [address] Due to distance. Thank You

## 2024-06-14 ENCOUNTER — Telehealth: Payer: Self-pay | Admitting: Family

## 2024-06-14 NOTE — Telephone Encounter (Signed)
 Copied from CRM [id number]. Topic: Medicare AWV >> Jun 14, 2024 11:11 AM Nathanel DEL wrote: Reason for CRM: LVM 06/14/2024 to schedule AWV. Please schedule Virtual or Telehealth visits ONLY.   Nathanel Paschal; Care Guide Ambulatory Clinical Support [facility] Direct Dial: [phone]

## 2024-07-08 ENCOUNTER — Other Ambulatory Visit: Payer: Self-pay | Admitting: Family

## 2024-07-08 DIAGNOSIS — E785 Hyperlipidemia, unspecified: Secondary | ICD-10-CM

## 2024-07-10 ENCOUNTER — Other Ambulatory Visit: Payer: Self-pay | Admitting: Family

## 2024-07-10 DIAGNOSIS — E785 Hyperlipidemia, unspecified: Secondary | ICD-10-CM

## 2024-08-04 ENCOUNTER — Other Ambulatory Visit: Payer: Self-pay | Admitting: Family

## 2024-08-04 DIAGNOSIS — E785 Hyperlipidemia, unspecified: Secondary | ICD-10-CM

## 2024-10-03 ENCOUNTER — Other Ambulatory Visit: Payer: Self-pay | Admitting: Family

## 2024-10-03 DIAGNOSIS — K219 Gastro-esophageal reflux disease without esophagitis: Secondary | ICD-10-CM

## 2024-10-28 ENCOUNTER — Other Ambulatory Visit: Payer: Self-pay

## 2024-10-28 DIAGNOSIS — M810 Age-related osteoporosis without current pathological fracture: Secondary | ICD-10-CM

## 2024-10-28 MED ORDER — ALENDRONATE SODIUM 70 MG PO TABS: 70.0000 mg | ORAL_TABLET | ORAL | 3 refills | Status: AC

## 2024-10-30 ENCOUNTER — Other Ambulatory Visit: Payer: Self-pay | Admitting: Family

## 2024-10-30 DIAGNOSIS — E785 Hyperlipidemia, unspecified: Secondary | ICD-10-CM

## 2024-11-02 ENCOUNTER — Other Ambulatory Visit: Payer: Self-pay | Admitting: Family

## 2024-11-02 DIAGNOSIS — E785 Hyperlipidemia, unspecified: Secondary | ICD-10-CM

## 2024-12-31 ENCOUNTER — Other Ambulatory Visit: Payer: Self-pay | Admitting: Medical

## 2024-12-31 DIAGNOSIS — K219 Gastro-esophageal reflux disease without esophagitis: Secondary | ICD-10-CM

## 2025-01-10 ENCOUNTER — Encounter: Payer: Self-pay | Admitting: Gastroenterology

## 2025-01-26 ENCOUNTER — Encounter: Admitting: Family

## 2025-01-28 ENCOUNTER — Other Ambulatory Visit: Payer: Self-pay | Admitting: Family

## 2025-01-28 DIAGNOSIS — E785 Hyperlipidemia, unspecified: Secondary | ICD-10-CM

## 2025-03-16 ENCOUNTER — Encounter: Admitting: Family
